# Patient Record
Sex: Female | Born: 1945 | Race: Black or African American | Hispanic: No | Marital: Single
Health system: Southern US, Community
[De-identification: ages and names within clinical notes are randomized; demographics above are authoritative.]

---

## 1998-07-29 ENCOUNTER — Encounter: Admission: RE | Admit: 1998-07-29 | Discharge: 1998-07-29 | Payer: Self-pay | Admitting: Internal Medicine

## 1998-08-27 ENCOUNTER — Encounter: Admission: RE | Admit: 1998-08-27 | Discharge: 1998-08-27 | Payer: Self-pay | Admitting: Internal Medicine

## 1998-09-11 ENCOUNTER — Encounter: Admission: RE | Admit: 1998-09-11 | Discharge: 1998-09-11 | Payer: Self-pay | Admitting: Internal Medicine

## 1998-10-10 ENCOUNTER — Encounter: Admission: RE | Admit: 1998-10-10 | Discharge: 1998-10-10 | Payer: Self-pay | Admitting: Internal Medicine

## 1998-10-17 ENCOUNTER — Encounter: Admission: RE | Admit: 1998-10-17 | Discharge: 1998-10-17 | Payer: Self-pay | Admitting: Hematology and Oncology

## 1998-11-22 ENCOUNTER — Encounter: Admission: RE | Admit: 1998-11-22 | Discharge: 1998-11-22 | Payer: Self-pay | Admitting: Internal Medicine

## 1998-12-06 ENCOUNTER — Encounter: Admission: RE | Admit: 1998-12-06 | Discharge: 1998-12-06 | Payer: Self-pay | Admitting: Internal Medicine

## 1999-02-05 ENCOUNTER — Encounter: Admission: RE | Admit: 1999-02-05 | Discharge: 1999-02-05 | Payer: Self-pay | Admitting: Internal Medicine

## 1999-02-18 ENCOUNTER — Encounter: Admission: RE | Admit: 1999-02-18 | Discharge: 1999-02-18 | Payer: Self-pay | Admitting: Internal Medicine

## 1999-03-21 ENCOUNTER — Encounter: Admission: RE | Admit: 1999-03-21 | Discharge: 1999-03-21 | Payer: Self-pay | Admitting: Internal Medicine

## 1999-04-24 ENCOUNTER — Encounter: Payer: Self-pay | Admitting: *Deleted

## 1999-04-24 ENCOUNTER — Emergency Department (HOSPITAL_COMMUNITY): Admission: EM | Admit: 1999-04-24 | Discharge: 1999-04-24 | Payer: Self-pay | Admitting: Emergency Medicine

## 1999-04-24 ENCOUNTER — Encounter: Admission: RE | Admit: 1999-04-24 | Discharge: 1999-04-24 | Payer: Self-pay | Admitting: Internal Medicine

## 1999-04-28 ENCOUNTER — Encounter: Admission: RE | Admit: 1999-04-28 | Discharge: 1999-04-28 | Payer: Self-pay | Admitting: Internal Medicine

## 1999-06-04 ENCOUNTER — Encounter: Admission: RE | Admit: 1999-06-04 | Discharge: 1999-06-04 | Payer: Self-pay | Admitting: Internal Medicine

## 1999-07-30 ENCOUNTER — Encounter: Admission: RE | Admit: 1999-07-30 | Discharge: 1999-07-30 | Payer: Self-pay | Admitting: Internal Medicine

## 1999-09-24 ENCOUNTER — Encounter: Admission: RE | Admit: 1999-09-24 | Discharge: 1999-09-24 | Payer: Self-pay | Admitting: Internal Medicine

## 1999-10-10 ENCOUNTER — Encounter: Admission: RE | Admit: 1999-10-10 | Discharge: 1999-10-10 | Payer: Self-pay | Admitting: Internal Medicine

## 1999-12-03 ENCOUNTER — Encounter: Admission: RE | Admit: 1999-12-03 | Discharge: 1999-12-03 | Payer: Self-pay | Admitting: Internal Medicine

## 2000-02-03 ENCOUNTER — Encounter: Admission: RE | Admit: 2000-02-03 | Discharge: 2000-02-03 | Payer: Self-pay | Admitting: Internal Medicine

## 2000-04-07 ENCOUNTER — Encounter: Admission: RE | Admit: 2000-04-07 | Discharge: 2000-04-07 | Payer: Self-pay | Admitting: Internal Medicine

## 2000-05-27 ENCOUNTER — Ambulatory Visit (HOSPITAL_BASED_OUTPATIENT_CLINIC_OR_DEPARTMENT_OTHER): Admission: RE | Admit: 2000-05-27 | Discharge: 2000-05-27 | Payer: Self-pay | Admitting: Orthopedic Surgery

## 2000-06-01 ENCOUNTER — Ambulatory Visit (HOSPITAL_COMMUNITY): Admission: RE | Admit: 2000-06-01 | Discharge: 2000-06-01 | Payer: Self-pay | Admitting: Orthopaedic Surgery

## 2000-06-24 ENCOUNTER — Emergency Department (HOSPITAL_COMMUNITY): Admission: EM | Admit: 2000-06-24 | Discharge: 2000-06-24 | Payer: Self-pay | Admitting: Emergency Medicine

## 2000-06-24 ENCOUNTER — Encounter: Payer: Self-pay | Admitting: Emergency Medicine

## 2000-07-19 ENCOUNTER — Encounter: Admission: RE | Admit: 2000-07-19 | Discharge: 2000-07-19 | Payer: Self-pay | Admitting: Internal Medicine

## 2000-08-23 ENCOUNTER — Encounter: Admission: RE | Admit: 2000-08-23 | Discharge: 2000-08-23 | Payer: Self-pay | Admitting: Internal Medicine

## 2000-08-25 ENCOUNTER — Inpatient Hospital Stay (HOSPITAL_COMMUNITY): Admission: EM | Admit: 2000-08-25 | Discharge: 2000-08-27 | Payer: Self-pay | Admitting: Emergency Medicine

## 2000-08-25 ENCOUNTER — Encounter: Payer: Self-pay | Admitting: Infectious Diseases

## 2000-08-30 ENCOUNTER — Encounter: Admission: RE | Admit: 2000-08-30 | Discharge: 2000-08-30 | Payer: Self-pay

## 2000-09-02 ENCOUNTER — Emergency Department (HOSPITAL_COMMUNITY): Admission: EM | Admit: 2000-09-02 | Discharge: 2000-09-02 | Payer: Self-pay | Admitting: Emergency Medicine

## 2000-09-03 ENCOUNTER — Encounter: Admission: RE | Admit: 2000-09-03 | Discharge: 2000-09-03 | Payer: Self-pay

## 2000-09-06 ENCOUNTER — Encounter: Admission: RE | Admit: 2000-09-06 | Discharge: 2000-09-06 | Payer: Self-pay

## 2000-09-22 ENCOUNTER — Encounter: Admission: RE | Admit: 2000-09-22 | Discharge: 2000-09-22 | Payer: Self-pay | Admitting: Internal Medicine

## 2000-12-24 ENCOUNTER — Encounter: Admission: RE | Admit: 2000-12-24 | Discharge: 2000-12-24 | Payer: Self-pay | Admitting: Internal Medicine

## 2001-07-06 ENCOUNTER — Encounter: Admission: RE | Admit: 2001-07-06 | Discharge: 2001-07-06 | Payer: Self-pay | Admitting: Internal Medicine

## 2001-07-07 ENCOUNTER — Emergency Department (HOSPITAL_COMMUNITY): Admission: EM | Admit: 2001-07-07 | Discharge: 2001-07-07 | Payer: Self-pay | Admitting: Emergency Medicine

## 2001-07-07 ENCOUNTER — Encounter: Payer: Self-pay | Admitting: Emergency Medicine

## 2001-07-08 ENCOUNTER — Encounter: Admission: RE | Admit: 2001-07-08 | Discharge: 2001-07-08 | Payer: Self-pay

## 2001-07-13 ENCOUNTER — Encounter: Admission: RE | Admit: 2001-07-13 | Discharge: 2001-07-13 | Payer: Self-pay | Admitting: Internal Medicine

## 2001-08-04 ENCOUNTER — Encounter: Admission: RE | Admit: 2001-08-04 | Discharge: 2001-08-04 | Payer: Self-pay | Admitting: Internal Medicine

## 2001-09-20 ENCOUNTER — Encounter: Payer: Self-pay | Admitting: Gastroenterology

## 2001-09-20 ENCOUNTER — Encounter: Admission: RE | Admit: 2001-09-20 | Discharge: 2001-09-20 | Payer: Self-pay | Admitting: Gastroenterology

## 2003-11-18 ENCOUNTER — Emergency Department (HOSPITAL_COMMUNITY): Admission: EM | Admit: 2003-11-18 | Discharge: 2003-11-18 | Payer: Self-pay | Admitting: Emergency Medicine

## 2004-03-09 ENCOUNTER — Emergency Department (HOSPITAL_COMMUNITY): Admission: EM | Admit: 2004-03-09 | Discharge: 2004-03-09 | Payer: Self-pay | Admitting: Family Medicine

## 2004-04-01 ENCOUNTER — Emergency Department (HOSPITAL_COMMUNITY): Admission: EM | Admit: 2004-04-01 | Discharge: 2004-04-02 | Payer: Self-pay | Admitting: Emergency Medicine

## 2004-07-24 ENCOUNTER — Emergency Department (HOSPITAL_COMMUNITY): Admission: EM | Admit: 2004-07-24 | Discharge: 2004-07-24 | Payer: Self-pay | Admitting: Family Medicine

## 2007-05-24 ENCOUNTER — Emergency Department (HOSPITAL_COMMUNITY): Admission: EM | Admit: 2007-05-24 | Discharge: 2007-05-24 | Payer: Self-pay | Admitting: Emergency Medicine

## 2008-01-17 ENCOUNTER — Emergency Department (HOSPITAL_COMMUNITY): Admission: EM | Admit: 2008-01-17 | Discharge: 2008-01-17 | Payer: Self-pay | Admitting: Emergency Medicine

## 2008-04-06 ENCOUNTER — Encounter: Payer: Self-pay | Admitting: Internal Medicine

## 2008-12-12 ENCOUNTER — Observation Stay: Payer: Self-pay | Admitting: Internal Medicine

## 2008-12-28 ENCOUNTER — Ambulatory Visit: Payer: Self-pay | Admitting: Cardiology

## 2008-12-28 ENCOUNTER — Inpatient Hospital Stay (HOSPITAL_COMMUNITY): Admission: EM | Admit: 2008-12-28 | Discharge: 2008-12-30 | Payer: Self-pay | Admitting: Emergency Medicine

## 2008-12-28 ENCOUNTER — Encounter (INDEPENDENT_AMBULATORY_CARE_PROVIDER_SITE_OTHER): Payer: Self-pay | Admitting: Internal Medicine

## 2009-02-27 ENCOUNTER — Inpatient Hospital Stay (HOSPITAL_COMMUNITY): Admission: EM | Admit: 2009-02-27 | Discharge: 2009-03-01 | Payer: Self-pay | Admitting: Emergency Medicine

## 2009-02-27 ENCOUNTER — Ambulatory Visit: Payer: Self-pay | Admitting: Infectious Diseases

## 2009-03-07 ENCOUNTER — Emergency Department (HOSPITAL_COMMUNITY): Admission: EM | Admit: 2009-03-07 | Discharge: 2009-03-07 | Payer: Self-pay | Admitting: Emergency Medicine

## 2009-03-29 ENCOUNTER — Inpatient Hospital Stay: Payer: Self-pay | Admitting: Psychiatry

## 2009-04-04 ENCOUNTER — Ambulatory Visit: Payer: Self-pay | Admitting: Unknown Physician Specialty

## 2009-04-15 ENCOUNTER — Ambulatory Visit: Payer: Self-pay | Admitting: Unknown Physician Specialty

## 2009-05-15 ENCOUNTER — Ambulatory Visit: Payer: Self-pay | Admitting: Unknown Physician Specialty

## 2009-07-03 ENCOUNTER — Ambulatory Visit: Payer: Self-pay | Admitting: Physician Assistant

## 2009-10-16 ENCOUNTER — Ambulatory Visit: Payer: Self-pay | Admitting: Unknown Physician Specialty

## 2010-01-08 ENCOUNTER — Inpatient Hospital Stay: Payer: Self-pay | Admitting: Internal Medicine

## 2010-04-03 ENCOUNTER — Ambulatory Visit: Payer: Self-pay | Admitting: Gastroenterology

## 2010-04-23 ENCOUNTER — Ambulatory Visit: Payer: Self-pay | Admitting: Gastroenterology

## 2010-05-14 ENCOUNTER — Ambulatory Visit: Payer: Self-pay | Admitting: Unknown Physician Specialty

## 2010-06-10 ENCOUNTER — Inpatient Hospital Stay: Payer: Self-pay | Admitting: Internal Medicine

## 2010-06-18 ENCOUNTER — Emergency Department: Payer: Self-pay | Admitting: Emergency Medicine

## 2010-07-18 ENCOUNTER — Ambulatory Visit: Payer: Self-pay | Admitting: Internal Medicine

## 2010-09-02 ENCOUNTER — Ambulatory Visit: Payer: Self-pay | Admitting: Unknown Physician Specialty

## 2010-09-09 ENCOUNTER — Ambulatory Visit: Payer: Self-pay | Admitting: Unknown Physician Specialty

## 2010-09-10 ENCOUNTER — Emergency Department (HOSPITAL_COMMUNITY): Payer: Medicare Other

## 2010-09-10 ENCOUNTER — Inpatient Hospital Stay (HOSPITAL_COMMUNITY)
Admission: EM | Admit: 2010-09-10 | Discharge: 2010-09-15 | DRG: 069 | Disposition: A | Discharge status: Rehabilitation Facility | Payer: Medicare Other | Attending: Family Medicine | Admitting: Family Medicine

## 2010-09-10 DIAGNOSIS — F319 Bipolar disorder, unspecified: Secondary | ICD-10-CM | POA: Diagnosis present

## 2010-09-10 DIAGNOSIS — I10 Essential (primary) hypertension: Secondary | ICD-10-CM | POA: Diagnosis present

## 2010-09-10 DIAGNOSIS — IMO0002 Reserved for concepts with insufficient information to code with codable children: Secondary | ICD-10-CM | POA: Diagnosis present

## 2010-09-10 DIAGNOSIS — Z7902 Long term (current) use of antithrombotics/antiplatelets: Secondary | ICD-10-CM

## 2010-09-10 DIAGNOSIS — Z7982 Long term (current) use of aspirin: Secondary | ICD-10-CM

## 2010-09-10 DIAGNOSIS — Z8619 Personal history of other infectious and parasitic diseases: Secondary | ICD-10-CM

## 2010-09-10 DIAGNOSIS — F17200 Nicotine dependence, unspecified, uncomplicated: Secondary | ICD-10-CM | POA: Diagnosis present

## 2010-09-10 DIAGNOSIS — G8190 Hemiplegia, unspecified affecting unspecified side: Secondary | ICD-10-CM | POA: Diagnosis present

## 2010-09-10 DIAGNOSIS — K7460 Unspecified cirrhosis of liver: Secondary | ICD-10-CM | POA: Diagnosis present

## 2010-09-10 DIAGNOSIS — Z9181 History of falling: Secondary | ICD-10-CM

## 2010-09-10 DIAGNOSIS — I252 Old myocardial infarction: Secondary | ICD-10-CM

## 2010-09-10 DIAGNOSIS — R52 Pain, unspecified: Secondary | ICD-10-CM | POA: Diagnosis present

## 2010-09-10 DIAGNOSIS — G459 Transient cerebral ischemic attack, unspecified: Principal | ICD-10-CM | POA: Diagnosis present

## 2010-09-10 DIAGNOSIS — Z880 Allergy status to penicillin: Secondary | ICD-10-CM

## 2010-09-10 LAB — COMPREHENSIVE METABOLIC PANEL
Alkaline Phosphatase: 81 U/L (ref 39–117)
BUN: 15 mg/dL (ref 6–23)
Chloride: 105 mEq/L (ref 96–112)
Creatinine, Ser: 0.94 mg/dL (ref 0.4–1.2)
GFR calc non Af Amer: 60 mL/min — ABNORMAL LOW (ref >60)
Glucose, Bld: 114 mg/dL — ABNORMAL HIGH (ref 70–99)
Potassium: 4.1 mEq/L (ref 3.5–5.1)
Total Bilirubin: 0.8 mg/dL (ref 0.3–1.2)

## 2010-09-10 LAB — ETHANOL: Alcohol, Ethyl (B): <5 mg/dL (ref 0–10)

## 2010-09-10 LAB — TROPONIN I: Troponin I: 0.05 ng/mL (ref 0.00–0.06)

## 2010-09-10 LAB — CK TOTAL AND CKMB (NOT AT ARMC): Relative Index: 0.4 (ref 0.0–2.5)

## 2010-09-10 LAB — PROTIME-INR: Prothrombin Time: 12 seconds (ref 11.6–15.2)

## 2010-09-10 LAB — POCT I-STAT, CHEM 8
Creatinine, Ser: 1 mg/dL (ref 0.4–1.2)
Glucose, Bld: 109 mg/dL — ABNORMAL HIGH (ref 70–99)
HCT: 39 % (ref 36.0–46.0)
Hemoglobin: 13.3 g/dL (ref 12.0–15.0)
Potassium: 4 mEq/L (ref 3.5–5.1)
Sodium: 140 mEq/L (ref 135–145)
TCO2: 27 mmol/L (ref 0–100)

## 2010-09-10 LAB — DIFFERENTIAL
Basophils Absolute: 0 10*3/uL (ref 0.0–0.1)
Basophils Relative: 0 % (ref 0–1)
Eosinophils Absolute: 0.1 10*3/uL (ref 0.0–0.7)
Monocytes Relative: 11 % (ref 3–12)
Neutro Abs: 3.9 10*3/uL (ref 1.7–7.7)
Neutrophils Relative %: 55 % (ref 43–77)

## 2010-09-10 LAB — CBC
Hemoglobin: 12.1 g/dL (ref 12.0–15.0)
MCH: 32.3 pg (ref 26.0–34.0)
MCHC: 31.3 g/dL (ref 30.0–36.0)
Platelets: 161 10*3/uL (ref 150–400)
RBC: 3.75 MIL/uL — ABNORMAL LOW (ref 3.87–5.11)

## 2010-09-10 LAB — APTT: aPTT: 22 seconds — ABNORMAL LOW (ref 24–37)

## 2010-09-10 MED ORDER — IOHEXOL 350 MG/ML SOLN: 50.0000 mL | Freq: Once | INTRAVENOUS | Status: AC | PRN

## 2010-09-11 DIAGNOSIS — R5383 Other fatigue: Secondary | ICD-10-CM

## 2010-09-11 DIAGNOSIS — R4182 Altered mental status, unspecified: Secondary | ICD-10-CM

## 2010-09-11 DIAGNOSIS — R5381 Other malaise: Secondary | ICD-10-CM

## 2010-09-11 LAB — CARDIAC PANEL(CRET KIN+CKTOT+MB+TROPI)
CK, MB: 2 ng/mL (ref 0.3–4.0)
Relative Index: 0.4 (ref 0.0–2.5)
Total CK: 490 U/L — ABNORMAL HIGH (ref 7–177)
Troponin I: 0.04 ng/mL (ref 0.00–0.06)

## 2010-09-11 LAB — CBC
HCT: 36.1 % (ref 36.0–46.0)
Hemoglobin: 11.3 g/dL — ABNORMAL LOW (ref 12.0–15.0)
MCH: 31.7 pg (ref 26.0–34.0)
MCV: 101.1 fL — ABNORMAL HIGH (ref 78.0–100.0)
Platelets: 157 10*3/uL (ref 150–400)
RBC: 3.57 MIL/uL — ABNORMAL LOW (ref 3.87–5.11)
WBC: 6.1 10*3/uL (ref 4.0–10.5)

## 2010-09-11 LAB — BASIC METABOLIC PANEL
BUN: 12 mg/dL (ref 6–23)
CO2: 27 mEq/L (ref 19–32)
Chloride: 104 mEq/L (ref 96–112)
Potassium: 3.8 mEq/L (ref 3.5–5.1)

## 2010-09-11 LAB — LIPID PANEL
Cholesterol: 195 mg/dL (ref 0–200)
HDL: 70 mg/dL (ref >39)
LDL Cholesterol: 101 mg/dL — ABNORMAL HIGH (ref 0–99)

## 2010-09-11 LAB — VITAMIN B12: Vitamin B-12: 346 pg/mL (ref 211–911)

## 2010-09-11 LAB — RAPID URINE DRUG SCREEN, HOSP PERFORMED: Barbiturates: NOT DETECTED

## 2010-09-11 LAB — TSH: TSH: 1.844 u[IU]/mL (ref 0.350–4.500)

## 2010-09-13 LAB — CBC
HCT: 36.5 % (ref 36.0–46.0)
Hemoglobin: 11.5 g/dL — ABNORMAL LOW (ref 12.0–15.0)
MCV: 101.7 fL — ABNORMAL HIGH (ref 78.0–100.0)
RBC: 3.59 MIL/uL — ABNORMAL LOW (ref 3.87–5.11)
RDW: 13.8 % (ref 11.5–15.5)
WBC: 5.9 10*3/uL (ref 4.0–10.5)

## 2010-09-13 LAB — BASIC METABOLIC PANEL
BUN: 13 mg/dL (ref 6–23)
CO2: 28 mEq/L (ref 19–32)
Chloride: 105 mEq/L (ref 96–112)
Glucose, Bld: 115 mg/dL — ABNORMAL HIGH (ref 70–99)
Potassium: 3.7 mEq/L (ref 3.5–5.1)
Sodium: 139 mEq/L (ref 135–145)

## 2010-09-15 ENCOUNTER — Other Ambulatory Visit (HOSPITAL_COMMUNITY): Payer: Medicare Other

## 2010-09-15 ENCOUNTER — Inpatient Hospital Stay (HOSPITAL_COMMUNITY)
Admission: RE | Admit: 2010-09-15 | Discharge: 2010-09-23 | DRG: 945 | Disposition: A | Discharge status: Other Acute Inpatient Hospital | Payer: Medicare Other | Source: Other Acute Inpatient Hospital | Attending: Physical Medicine & Rehabilitation | Admitting: Physical Medicine & Rehabilitation

## 2010-09-15 ENCOUNTER — Inpatient Hospital Stay (HOSPITAL_COMMUNITY): Payer: Medicare Other

## 2010-09-15 DIAGNOSIS — R269 Unspecified abnormalities of gait and mobility: Secondary | ICD-10-CM

## 2010-09-15 DIAGNOSIS — R5381 Other malaise: Secondary | ICD-10-CM

## 2010-09-15 DIAGNOSIS — I2510 Atherosclerotic heart disease of native coronary artery without angina pectoris: Secondary | ICD-10-CM | POA: Diagnosis present

## 2010-09-15 DIAGNOSIS — I6350 Cerebral infarction due to unspecified occlusion or stenosis of unspecified cerebral artery: Secondary | ICD-10-CM | POA: Diagnosis present

## 2010-09-15 DIAGNOSIS — I10 Essential (primary) hypertension: Secondary | ICD-10-CM | POA: Diagnosis present

## 2010-09-15 DIAGNOSIS — F319 Bipolar disorder, unspecified: Secondary | ICD-10-CM | POA: Diagnosis present

## 2010-09-15 DIAGNOSIS — Z9889 Other specified postprocedural states: Secondary | ICD-10-CM

## 2010-09-15 DIAGNOSIS — I252 Old myocardial infarction: Secondary | ICD-10-CM

## 2010-09-15 DIAGNOSIS — N179 Acute kidney failure, unspecified: Secondary | ICD-10-CM | POA: Diagnosis not present

## 2010-09-15 DIAGNOSIS — I672 Cerebral atherosclerosis: Secondary | ICD-10-CM | POA: Diagnosis present

## 2010-09-15 DIAGNOSIS — Z66 Do not resuscitate: Secondary | ICD-10-CM | POA: Diagnosis present

## 2010-09-15 DIAGNOSIS — G8190 Hemiplegia, unspecified affecting unspecified side: Secondary | ICD-10-CM | POA: Diagnosis present

## 2010-09-15 DIAGNOSIS — N390 Urinary tract infection, site not specified: Secondary | ICD-10-CM | POA: Diagnosis not present

## 2010-09-15 DIAGNOSIS — F0150 Vascular dementia without behavioral disturbance: Secondary | ICD-10-CM | POA: Diagnosis present

## 2010-09-15 DIAGNOSIS — G609 Hereditary and idiopathic neuropathy, unspecified: Secondary | ICD-10-CM | POA: Diagnosis present

## 2010-09-15 DIAGNOSIS — Z7902 Long term (current) use of antithrombotics/antiplatelets: Secondary | ICD-10-CM

## 2010-09-15 DIAGNOSIS — Z880 Allergy status to penicillin: Secondary | ICD-10-CM

## 2010-09-15 DIAGNOSIS — Z5189 Encounter for other specified aftercare: Secondary | ICD-10-CM

## 2010-09-15 DIAGNOSIS — Z8619 Personal history of other infectious and parasitic diseases: Secondary | ICD-10-CM

## 2010-09-15 DIAGNOSIS — F17200 Nicotine dependence, unspecified, uncomplicated: Secondary | ICD-10-CM | POA: Diagnosis present

## 2010-09-15 DIAGNOSIS — IMO0002 Reserved for concepts with insufficient information to code with codable children: Secondary | ICD-10-CM | POA: Diagnosis present

## 2010-09-15 DIAGNOSIS — J45909 Unspecified asthma, uncomplicated: Secondary | ICD-10-CM | POA: Diagnosis present

## 2010-09-15 DIAGNOSIS — Z9181 History of falling: Secondary | ICD-10-CM

## 2010-09-15 DIAGNOSIS — G9341 Metabolic encephalopathy: Secondary | ICD-10-CM

## 2010-09-15 DIAGNOSIS — F22 Delusional disorders: Secondary | ICD-10-CM | POA: Diagnosis not present

## 2010-09-15 DIAGNOSIS — R1310 Dysphagia, unspecified: Secondary | ICD-10-CM | POA: Diagnosis not present

## 2010-09-16 DIAGNOSIS — Z5189 Encounter for other specified aftercare: Secondary | ICD-10-CM

## 2010-09-16 DIAGNOSIS — G9341 Metabolic encephalopathy: Secondary | ICD-10-CM

## 2010-09-16 DIAGNOSIS — R269 Unspecified abnormalities of gait and mobility: Secondary | ICD-10-CM

## 2010-09-16 LAB — CBC
HCT: 39.2 % (ref 36.0–46.0)
MCH: 31.9 pg (ref 26.0–34.0)
MCV: 98.5 fL (ref 78.0–100.0)
Platelets: 188 10*3/uL (ref 150–400)
RDW: 13.2 % (ref 11.5–15.5)

## 2010-09-16 LAB — COMPREHENSIVE METABOLIC PANEL
ALT: 40 U/L — ABNORMAL HIGH (ref 0–35)
Alkaline Phosphatase: 81 U/L (ref 39–117)
BUN: 12 mg/dL (ref 6–23)
CO2: 26 mEq/L (ref 19–32)
Calcium: 9.3 mg/dL (ref 8.4–10.5)
GFR calc non Af Amer: >60 mL/min (ref >60)
Glucose, Bld: 95 mg/dL (ref 70–99)
Potassium: 3.4 mEq/L — ABNORMAL LOW (ref 3.5–5.1)
Sodium: 137 mEq/L (ref 135–145)
Total Protein: 6.8 g/dL (ref 6.0–8.3)

## 2010-09-16 LAB — DIFFERENTIAL
Eosinophils Absolute: 0.1 10*3/uL (ref 0.0–0.7)
Eosinophils Relative: 1 % (ref 0–5)
Lymphocytes Relative: 26 % (ref 12–46)
Lymphs Abs: 2.3 10*3/uL (ref 0.7–4.0)
Monocytes Relative: 6 % (ref 3–12)

## 2010-09-18 LAB — URINALYSIS, ROUTINE W REFLEX MICROSCOPIC
Glucose, UA: NEGATIVE mg/dL
Ketones, ur: NEGATIVE mg/dL
Leukocytes, UA: NEGATIVE
Nitrite: NEGATIVE
Protein, ur: NEGATIVE mg/dL

## 2010-09-18 LAB — BASIC METABOLIC PANEL
BUN: 22 mg/dL (ref 6–23)
CO2: 24 mEq/L (ref 19–32)
Calcium: 9.5 mg/dL (ref 8.4–10.5)
Chloride: 103 mEq/L (ref 96–112)
Creatinine, Ser: 1.11 mg/dL (ref 0.4–1.2)
Glucose, Bld: 103 mg/dL — ABNORMAL HIGH (ref 70–99)

## 2010-09-18 LAB — URINE MICROSCOPIC-ADD ON

## 2010-09-19 LAB — URINALYSIS, ROUTINE W REFLEX MICROSCOPIC
Bilirubin Urine: NEGATIVE
Hgb urine dipstick: NEGATIVE
Ketones, ur: 15 mg/dL — AB
Ketones, ur: NEGATIVE mg/dL
Nitrite: NEGATIVE
Specific Gravity, Urine: 1.034 — ABNORMAL HIGH (ref 1.005–1.030)
Specific Gravity, Urine: 1.01 (ref 1.005–1.030)
Urobilinogen, UA: 0.2 mg/dL (ref 0.0–1.0)
pH: 6 (ref 5.0–8.0)

## 2010-09-19 LAB — CK TOTAL AND CKMB (NOT AT ARMC)
CK, MB: 1.1 ng/mL (ref 0.3–4.0)
CK, MB: 1.1 ng/mL (ref 0.3–4.0)
CK, MB: 1.2 ng/mL (ref 0.3–4.0)
Relative Index: INVALID (ref 0.0–2.5)
Total CK: 33 U/L (ref 7–177)

## 2010-09-19 LAB — BASIC METABOLIC PANEL
CO2: 24 mEq/L (ref 19–32)
Calcium: 9.1 mg/dL (ref 8.4–10.5)
Chloride: 107 mEq/L (ref 96–112)
Chloride: 109 mEq/L (ref 96–112)
GFR calc Af Amer: >60 mL/min (ref >60)
GFR calc non Af Amer: >60 mL/min (ref >60)
GFR calc non Af Amer: >60 mL/min (ref >60)
Glucose, Bld: 104 mg/dL — ABNORMAL HIGH (ref 70–99)
Glucose, Bld: 83 mg/dL (ref 70–99)
Glucose, Bld: 104 mg/dL — ABNORMAL HIGH (ref 70–99)
Potassium: 4.1 mEq/L (ref 3.5–5.1)
Potassium: 4.2 mEq/L (ref 3.5–5.1)
Potassium: 4.2 mEq/L (ref 3.5–5.1)
Sodium: 136 mEq/L (ref 135–145)
Sodium: 139 mEq/L (ref 135–145)
Sodium: 138 mEq/L (ref 135–145)

## 2010-09-19 LAB — URINE MICROSCOPIC-ADD ON

## 2010-09-19 LAB — CBC
HCT: 37.3 % (ref 36.0–46.0)
HCT: 37.6 % (ref 36.0–46.0)
Hemoglobin: 12.4 g/dL (ref 12.0–15.0)
Hemoglobin: 13.3 g/dL (ref 12.0–15.0)
Hemoglobin: 12.4 g/dL (ref 12.0–15.0)
MCHC: 32.9 g/dL (ref 30.0–36.0)
MCV: 105 fL — ABNORMAL HIGH (ref 78.0–100.0)
MCV: 105.7 fL — ABNORMAL HIGH (ref 78.0–100.0)
RBC: 3.84 MIL/uL — ABNORMAL LOW (ref 3.87–5.11)
RDW: 13.4 % (ref 11.5–15.5)
RDW: 13.5 % (ref 11.5–15.5)
RDW: 13.6 % (ref 11.5–15.5)
WBC: 6.6 10*3/uL (ref 4.0–10.5)
WBC: 6.9 10*3/uL (ref 4.0–10.5)

## 2010-09-19 LAB — TROPONIN I
Troponin I: 0.02 ng/mL (ref 0.00–0.06)
Troponin I: 0.02 ng/mL (ref 0.00–0.06)

## 2010-09-19 LAB — RAPID URINE DRUG SCREEN, HOSP PERFORMED
Barbiturates: POSITIVE — AB
Opiates: POSITIVE — AB
Tetrahydrocannabinol: NOT DETECTED

## 2010-09-19 LAB — LIPASE, BLOOD: Lipase: 19 U/L (ref 11–59)

## 2010-09-19 LAB — DIFFERENTIAL
Eosinophils Relative: 0 % (ref 0–5)
Lymphocytes Relative: 38 % (ref 12–46)
Lymphs Abs: 2.4 10*3/uL (ref 0.7–4.0)
Monocytes Relative: 5 % (ref 3–12)

## 2010-09-19 LAB — HEPATIC FUNCTION PANEL
ALT: 61 U/L — ABNORMAL HIGH (ref 0–35)
Alkaline Phosphatase: 69 U/L (ref 39–117)
Indirect Bilirubin: 0.4 mg/dL (ref 0.3–0.9)
Total Protein: 6.8 g/dL (ref 6.0–8.3)

## 2010-09-19 LAB — LIPID PANEL
Total CHOL/HDL Ratio: 2.2 RATIO
VLDL: 19 mg/dL (ref 0–40)

## 2010-09-19 LAB — D-DIMER, QUANTITATIVE: D-Dimer, Quant: 0.23 ug/mL-FEU (ref 0.00–0.48)

## 2010-09-19 LAB — URINE CULTURE
Colony Count: NO GROWTH
Culture: NO GROWTH

## 2010-09-19 LAB — HIV ANTIBODY (ROUTINE TESTING W REFLEX): HIV: NONREACTIVE

## 2010-09-19 LAB — VITAMIN B12: Vitamin B-12: 740 pg/mL (ref 211–911)

## 2010-09-21 ENCOUNTER — Inpatient Hospital Stay (HOSPITAL_COMMUNITY): Payer: Medicare Other

## 2010-09-21 LAB — TSH: TSH: 3.179 u[IU]/mL (ref 0.350–4.500)

## 2010-09-21 LAB — COMPREHENSIVE METABOLIC PANEL
ALT: 19 U/L (ref 0–35)
Alkaline Phosphatase: 87 U/L (ref 39–117)
Alkaline Phosphatase: 65 U/L (ref 39–117)
BUN: 4 mg/dL — ABNORMAL LOW (ref 6–23)
BUN: 31 mg/dL — ABNORMAL HIGH (ref 6–23)
CO2: 26 mEq/L (ref 19–32)
CO2: 18 mEq/L — ABNORMAL LOW (ref 19–32)
Calcium: 9.1 mg/dL (ref 8.4–10.5)
Chloride: 105 mEq/L (ref 96–112)
GFR calc non Af Amer: >60 mL/min (ref >60)
GFR calc non Af Amer: 32 mL/min — ABNORMAL LOW (ref >60)
Glucose, Bld: 87 mg/dL (ref 70–99)
Glucose, Bld: 107 mg/dL — ABNORMAL HIGH (ref 70–99)
Potassium: 4.7 mEq/L (ref 3.5–5.1)
Sodium: 140 mEq/L (ref 135–145)
Total Bilirubin: 0.8 mg/dL (ref 0.3–1.2)
Total Protein: 6.2 g/dL (ref 6.0–8.3)

## 2010-09-21 LAB — URINALYSIS, ROUTINE W REFLEX MICROSCOPIC
Glucose, UA: NEGATIVE mg/dL
Ketones, ur: 15 mg/dL — AB
Nitrite: POSITIVE — AB
Protein, ur: 30 mg/dL — AB

## 2010-09-21 LAB — CARDIAC PANEL(CRET KIN+CKTOT+MB+TROPI)
CK, MB: 1.3 ng/mL (ref 0.3–4.0)
Relative Index: INVALID (ref 0.0–2.5)
Total CK: 45 U/L (ref 7–177)
Troponin I: 0.01 ng/mL (ref 0.00–0.06)
Troponin I: 0.02 ng/mL (ref 0.00–0.06)

## 2010-09-21 LAB — BLOOD GAS, ARTERIAL
Acid-base deficit: 0.6 mmol/L (ref 0.0–2.0)
Acid-base deficit: 1.6 mmol/L (ref 0.0–2.0)
Drawn by: 23337
O2 Content: 2 L/min
O2 Saturation: 95.9 %
Patient temperature: 98.6
TCO2: 20.3 mmol/L (ref 0–100)
pCO2 arterial: 35.2 mmHg (ref 35.0–45.0)
pO2, Arterial: 65.1 mmHg — ABNORMAL LOW (ref 80.0–100.0)

## 2010-09-21 LAB — MAGNESIUM: Magnesium: 1.7 mg/dL (ref 1.5–2.5)

## 2010-09-21 LAB — LIPID PANEL
HDL: 65 mg/dL (ref >39)
Triglycerides: 87 mg/dL (ref <150)
VLDL: 17 mg/dL (ref 0–40)

## 2010-09-21 LAB — BASIC METABOLIC PANEL
Calcium: 9.1 mg/dL (ref 8.4–10.5)
Creatinine, Ser: 0.67 mg/dL (ref 0.4–1.2)
GFR calc Af Amer: >60 mL/min (ref >60)

## 2010-09-21 LAB — CBC
HCT: 40.5 % (ref 36.0–46.0)
HCT: 36.7 % (ref 36.0–46.0)
Hemoglobin: 13 g/dL (ref 12.0–15.0)
Hemoglobin: 11.8 g/dL — ABNORMAL LOW (ref 12.0–15.0)
MCHC: 32.2 g/dL (ref 30.0–36.0)
MCV: 101.7 fL — ABNORMAL HIGH (ref 78.0–100.0)
RBC: 3.73 MIL/uL — ABNORMAL LOW (ref 3.87–5.11)
RDW: 13.7 % (ref 11.5–15.5)
WBC: 9.5 10*3/uL (ref 4.0–10.5)

## 2010-09-21 LAB — HEMOGLOBIN A1C: Hgb A1c MFr Bld: 5.7 % (ref 4.6–6.1)

## 2010-09-22 LAB — CBC
HCT: 32.5 % — ABNORMAL LOW (ref 36.0–46.0)
HCT: 37.4 % (ref 36.0–46.0)
Hemoglobin: 10.2 g/dL — ABNORMAL LOW (ref 12.0–15.0)
MCH: 32.4 pg (ref 26.0–34.0)
MCHC: 31.4 g/dL (ref 30.0–36.0)
MCHC: 33.1 g/dL (ref 30.0–36.0)
MCV: 103.2 fL — ABNORMAL HIGH (ref 78.0–100.0)
MCV: 108.3 fL — ABNORMAL HIGH (ref 78.0–100.0)
Platelets: 185 10*3/uL (ref 150–400)
RDW: 13.8 % (ref 11.5–15.5)

## 2010-09-22 LAB — URINALYSIS, ROUTINE W REFLEX MICROSCOPIC
Ketones, ur: NEGATIVE mg/dL
Nitrite: NEGATIVE
Protein, ur: NEGATIVE mg/dL
Urobilinogen, UA: 1 mg/dL (ref 0.0–1.0)

## 2010-09-22 LAB — AMMONIA: Ammonia: 17 umol/L (ref 11–35)

## 2010-09-22 LAB — COMPREHENSIVE METABOLIC PANEL
AST: 32 U/L (ref 0–37)
Albumin: 2.9 g/dL — ABNORMAL LOW (ref 3.5–5.2)
BUN: 10 mg/dL (ref 6–23)
Calcium: 9.1 mg/dL (ref 8.4–10.5)
Creatinine, Ser: 0.79 mg/dL (ref 0.4–1.2)
GFR calc Af Amer: >60 mL/min (ref >60)
GFR calc non Af Amer: >60 mL/min (ref >60)

## 2010-09-22 LAB — URINE MICROSCOPIC-ADD ON

## 2010-09-22 LAB — BASIC METABOLIC PANEL
BUN: 25 mg/dL — ABNORMAL HIGH (ref 6–23)
CO2: 25 mEq/L (ref 19–32)
Calcium: 8.6 mg/dL (ref 8.4–10.5)
Glucose, Bld: 93 mg/dL (ref 70–99)
Sodium: 141 mEq/L (ref 135–145)

## 2010-09-22 LAB — DIFFERENTIAL
Basophils Absolute: 0 10*3/uL (ref 0.0–0.1)
Eosinophils Relative: 2 % (ref 0–5)
Lymphocytes Relative: 31 % (ref 12–46)
Lymphs Abs: 1.6 10*3/uL (ref 0.7–4.0)
Monocytes Absolute: 0.4 10*3/uL (ref 0.1–1.0)
Neutro Abs: 3.2 10*3/uL (ref 1.7–7.7)

## 2010-09-22 LAB — URINE CULTURE

## 2010-09-22 LAB — RAPID URINE DRUG SCREEN, HOSP PERFORMED
Amphetamines: NOT DETECTED
Benzodiazepines: POSITIVE — AB
Cocaine: NOT DETECTED
Tetrahydrocannabinol: NOT DETECTED

## 2010-09-22 LAB — PROTIME-INR: Prothrombin Time: 12.7 seconds (ref 11.6–15.2)

## 2010-09-23 ENCOUNTER — Inpatient Hospital Stay (HOSPITAL_COMMUNITY)
Admission: AD | Admit: 2010-09-23 | Discharge: 2010-09-27 | DRG: 948 | Disposition: A | Discharge status: Home Health | Payer: Medicare Other | Source: Ambulatory Visit | Attending: Family Medicine | Admitting: Family Medicine

## 2010-09-23 DIAGNOSIS — F319 Bipolar disorder, unspecified: Secondary | ICD-10-CM

## 2010-09-23 DIAGNOSIS — F05 Delirium due to known physiological condition: Secondary | ICD-10-CM

## 2010-09-23 DIAGNOSIS — Z5189 Encounter for other specified aftercare: Secondary | ICD-10-CM

## 2010-09-23 DIAGNOSIS — I6990 Unspecified sequelae of unspecified cerebrovascular disease: Secondary | ICD-10-CM

## 2010-09-23 DIAGNOSIS — R269 Unspecified abnormalities of gait and mobility: Secondary | ICD-10-CM

## 2010-09-23 DIAGNOSIS — K769 Liver disease, unspecified: Secondary | ICD-10-CM

## 2010-09-23 DIAGNOSIS — G9341 Metabolic encephalopathy: Secondary | ICD-10-CM

## 2010-09-23 LAB — URINE CULTURE

## 2010-09-24 DIAGNOSIS — F05 Delirium due to known physiological condition: Secondary | ICD-10-CM

## 2010-09-24 LAB — COMPREHENSIVE METABOLIC PANEL
BUN: 4 mg/dL — ABNORMAL LOW (ref 6–23)
Calcium: 9.3 mg/dL (ref 8.4–10.5)
Glucose, Bld: 109 mg/dL — ABNORMAL HIGH (ref 70–99)
Sodium: 138 mEq/L (ref 135–145)
Total Protein: 6.7 g/dL (ref 6.0–8.3)

## 2010-09-24 LAB — PROTIME-INR
INR: 1.03 (ref 0.00–1.49)
Prothrombin Time: 13.7 seconds (ref 11.6–15.2)

## 2010-09-24 LAB — CBC
MCH: 32 pg (ref 26.0–34.0)
MCHC: 31.8 g/dL (ref 30.0–36.0)
Platelets: 256 10*3/uL (ref 150–400)
RBC: 3.38 MIL/uL — ABNORMAL LOW (ref 3.87–5.11)

## 2010-09-24 LAB — PREALBUMIN: Prealbumin: 11 mg/dL — ABNORMAL LOW (ref 17.0–34.0)

## 2010-09-25 DIAGNOSIS — F05 Delirium due to known physiological condition: Secondary | ICD-10-CM

## 2010-09-25 NOTE — Consult Note (Signed)
NAME:  Amber [name], Amber [name] NO.:  [id number]  MEDICAL RECORD NO.:  [id number]           PATIENT TYPE:  I  LOCATION:  5522                         FACILITY:  [facility]  PHYSICIAN:  Anselm Jungling, MD  DATE OF BIRTH:  1945/09/03  DATE OF CONSULTATION:  09/24/2010 DATE OF DISCHARGE:                                CONSULTATION   IDENTIFYING DATA AND REASON FOR REFERRAL:  This is my first contact with Amber [name], a 65 year old African-American female currently being treated here at [facility].  Psychiatric consultation is requested to assess mental status and make recommendations in relation to the patient's recent mental status changes.  HISTORY OF PRESENTING PROBLEMS:  The following information was obtained from the [facility] chart, the patient, and her adult son, who is at the bedside today.  The patient has no prior psychiatric history, whatsoever.  She has been admitted to the hospital for a variety of conditions that include congestive heart failure, Lewy body dementia, possible hepatic encephalopathy, associated with falls.  In addition, she has a history of hepatitis C and cirrhosis.  She has been treated in the past apparently for some form of mood disorder with Neurontin, Lamictal, and benzodiazepines.  Further complicating her current course is an apparent urinary tract infection.  Psychiatric consultation was prompted by the patient having some frank signs and symptoms of delirium yesterday, September 23, 2010.  Apparently, the patient was given Seroquel 25 mg at bedtime, and today, both the patient, her son, and nursing notes indicate that the patient is vastly improved today.  The patient as mentioned above, has no history of serious psychiatric illness, the specific circumstances regarding her prescriptions for Neurontin and Lamictal are not clear.  CT scan of the head done on September 21, 2010, was negative for any acute changes.  MENTAL STATUS  AND OBSERVATIONS:  The patient is a well-nourished, normally-developed adult female, who was sitting up in bed, allowing her son to feed her lunch when I came to see her.  She was alert, and appears to be fully oriented.  She was pleasant and very appropriate in her interactions with me.  She was friendly and relaxed.  She states that she knew a psychiatrist was coming to see her.  I conversed with she and her son together for a while.  The son indicates to me today that although her thinking was quite disturbed yesterday, by his description, having vivid visual hallucinations and delusional ideation, he feels that cognitively she is back to normal today.  He is very pleased with this.  He states "whatever medication they gave her last night has made a tremendous difference."  The patient made no delusional statements with me, although I did not do any formal mental status testing.  She does not demonstrate any obvious or overt signs or symptoms of cognitive disturbance, memory impairment, paranoia, delusionality, or thought disorder.  IMPRESSION:  The patient has a vague history of treatment with certain medications for mood disorder, but apparently no serious psychiatric history.  She apparently was having some transient delirium associated with some paranoid ideation yesterday.  This  was undoubtedly the product of multiple metabolic and physiologic stressors, and at this time appears to be resolved.  It may have resolved spontaneously, or the Seroquel 25 mg given at bedtime may have been a significant contributor.  DIAGNOSTIC IMPRESSION:  Axis I:  Mood disorder NOS, status post delirium secondary to medical condition. Axis II:  Deferred. Axis III:  Please see medical notes. Axis IV:  Stressors severe. Axis V:  GAF 50.  RECOMMENDATIONS:  At this time, I would recommend continuing Seroquel 25 mg at bedtime.  It is my understanding from reading the chart that there may be an effort  underway to gradually taper Lamictal and this would seem to be a reasonable thing to do.  It is not clear how Lamictal could really be a significantly beneficial medication at this point. Nonetheless, should be tapered gradually due to its anticonvulsant nature.  In addition, benzodiazepines should be minimized.  I would be happy to follow this patient during the remainder of her stay.  Thank you for asking this consultation-liaison psychiatry to see this interesting patient.     Anselm Jungling, MD     SPB/MEDQ  D:  09/24/2010  T:  09/24/2010  Job:  [id number]  Electronically Signed by Geralyn Flash MD on 09/25/2010 09:43:03 AM

## 2010-09-25 NOTE — Consult Note (Signed)
NAME:  Amber [name], Amber [name] NO.:  [id number]  MEDICAL RECORD NO.:  [id number]           PATIENT TYPE:  I  LOCATION:  4153                         FACILITY:  [facility]  PHYSICIAN:  Thana Farr, MD    DATE OF BIRTH:  06/05/1946  DATE OF CONSULTATION:  09/16/2010 DATE OF DISCHARGE:                                CONSULTATION   REASON FOR CONSULTATION:  Confusion.  HISTORY OF PRESENT ILLNESS:  This is a 65 year old female who was initially admitted to [facility] on September 15, 2010, for right-sided weakness and confusion.  Initially, there was question of code stroke; however, the patient's symptoms quickly resolved, however, confusion remained.  The patient was admitted to the hospital for further evaluation and eventually transferred to rehab.  On rehab, the patient's confusion continued and Neurology was asked to see the patient.  In conversation with sister, the patient has been cared for by her daughter at home.  She does cook and clean to a small degree, but does not take care of bills or any other household chores.  Daughter helps with ADLs at home.  Over the past 3-4 months, the patient has had difficulty with multiple falls; however, these have been described as the patient getting up without assistance as being directed by other physicians.  While in rehab, the patient has had waxing and waning mental status.  At times, the patient has been alert and knows where she is.  At other times during her hospital stay, she is confused that where she is and has difficulty staying on task.  PAST MEDICAL HISTORY: 1. Hypertension. 2. Hypercholesterolemia. 3. Bipolar. 4. Right total knee arthroplasty. 5. Hep C. 6. Liver failure. 7. CAD, status post MI, status post stenting. 8. Ischemic cardiomyopathy. 9. Right rotator cuff surgery arthroscopically. 10.Asthma. 11.Left eye retinal detachment.  MEDICATIONS:  While in the hospital include aspirin, Coreg,  Plavix, Lamictal, nicotine, Protonix, Zocor, K-Dur, Spiriva, Dulcolax, clonidine, Phenergan, low-molecular-weight heparin, and oxycodone 5 mg q.6 h. p.r.n.  ALLERGIES:  PENICILLIN, DARVOCET, and TYLENOL.  SOCIAL HISTORY:  The patient does smoke one pack per day.  Does not drink or do illicit drugs.  She does have a history of alcohol abuse. The patient lives with her daughter who does the majority of the care for the patient, however, she is stated to clean the house and able to cook in the past.  She does have a history of multiple falls even with her daughter taking care of her.  REVIEW OF SYSTEMS:  Positive for confusion, peripheral neuropathy, multiple falls, decreased vision, right-sided weakness, right-sided numbness and tingling, and headache.  PHYSICAL EXAMINATION:  GENERAL:  The patient is alert. NEUROLOGIC:  She is oriented to place only with help during the exam such as giving her three options to where she is.  She will state that she is at [facility].  She is aware that it is April, but has difficulty with the year.  The patient is able to carry out two-step commands without any difficulty but is slow with these steps.  The patient's right pupil is equal, round, and reactive to  light.  Her extraocular movements in her right are intact.  She shows no ptosis. Visual fields are intact with her right eye.  Face appears symmetrical. Tongue is midline.  Uvula is midline.  The patient has garbled speech. However, she does not have her dentures in at this time.  Facial sensation is intact bilaterally.  Shoulder shrug and head turn is intact.  She is in a left abduction pillow secondary to shoulder surgery at this time.  Coordination with her right hand with finger-to-nose is smooth.  Heel-to-shin, the patient had difficulty taking part in this task secondary to range of motion and strength.  Gait was not tested at this time.  The patient's motor showed to be 4/5  strength in her right upper extremity.  Bilateral plantar flexion, dorsiflexion, bilateral knee extension and flexion shows 3+ strength with her hip flexion and abduction.  Deep tendon reflexes were 2+ throughout.  I noticed downgoing toes bilaterally.  I did not notice drift in her right upper extremity.  Bilateral lower extremities were weak, showing drift down. The patient showed bilateral lower extremity peripheral neuropathy with light touch and pinprick, otherwise intact. PULMONARY:  Clear to auscultation. CARDIOVASCULAR:  S1 and S2 are audible. NECK:  Negative for bruits.  LABORATORY DATA:  Sodium is 137, potassium 4.5, chloride 105, CO2 of 26, BUN 12, creatinine 0.71, glucose 95.  White blood cell count 9.0, platelets 188, hemoglobin 12.7, hematocrit 39.2.  Ammonia was 26.  HbA1c was 6.1.  TSH was 1.84.  IMAGING:  MRI was negative for acute stroke, however, did show positive atrophy and bilateral posterior horn white matter disease.  ASSESSMENT: 1. This is a pleasant 65 year old African American female with history     of multiple falls that have been more frequent for the past 3 months and confusion for the past 2 weeks,     which has been waxing and waning which may very well represent a contribution of medication.  This is the clinical scenario of a baseline mental status that is compromised by an underlying neurodegenerative process secondary to longstanding     alcoholism and age. 2. The patient's decreased balance is most likely secondary to     peripheral neuropathy along with decreased vision.  RECOMMENDATIONS:  Recommendations at this time are: 1. Use a walker or cane at all times for balance.  This will be     difficult at present time secondary to the patient having recently     undergone shoulder surgery. 2. Would recommend continuing aspirin and Plavix. 3. Recommend keeping narcotic use down to minimum, however, the     patient will need these medications to  some degree to      continue with her rehabilitation of her left shoulder.  \ 4. Would also recommend EEG to rule out any seizure or postictal issues.    I have discussed these findings with Dr. Thad Ranger, and she is in     agreement.  Thank you very much for this consultation.     Felicie Morn, PA-C   ______________________________ Thana Farr, MD    DS/MEDQ  D:  09/16/2010  T:  09/17/2010  Job:  [id number]  Electronically Signed by Felicie Morn PA-C on 09/17/2010 02:39:20 PM Electronically Signed by Thana Farr MD on 09/25/2010 11:38:07 AM

## 2010-09-26 DIAGNOSIS — F05 Delirium due to known physiological condition: Secondary | ICD-10-CM

## 2010-09-26 LAB — VITAMIN B1: Vitamin B1 (Thiamine): 13 nmol/L (ref 9–44)

## 2010-09-27 DIAGNOSIS — F05 Delirium due to known physiological condition: Secondary | ICD-10-CM

## 2010-09-30 NOTE — Discharge Summary (Signed)
NAME:  Amber [name], SANPEDRO NO.:  [id number]  MEDICAL RECORD NO.:  [id number]           PATIENT TYPE:  I  LOCATION:  4153                         FACILITY:  [facility]  PHYSICIAN:  Pearlean Brownie, M.D.DATE OF BIRTH:  December 21, 1945  DATE OF ADMISSION:  09/11/2010 DATE OF DISCHARGE:  09/15/2010                              DISCHARGE SUMMARY   ATTENDING DOCTOR:  Pearlean Brownie, MD  PRIMARY CARE PHYSICIAN:  Unassigned, PCP in [place], [place].  DISCHARGE DIAGNOSES: 1. Cerebral ischemia/transient ischemic attack with residual problems     with balance and right hemiparesis. 2. Pain management status post left shoulder surgery. 3. History of recurrent falls. 4. Right lower extremity skin abrasion. 5. History of congestive heart failure. 6. History of hepatitis C and liver cirrhosis.  DISCHARGE MEDICATIONS: 1. Hydrochlorothiazide 12.5 mg 1 tablet by mouth daily. 2. Oxycodone 5 mg immediate release 1 tablet by mouth every 6 hours as     needed for pain, take for 14 more days. 3. K-Dur 10 mEq 1 tablet by mouth daily. 4. Trazodone 50 mg 1-2 tablets by mouth daily at bedtime as needed for     insomnia. 5. Aspirin 1 tablet by mouth daily. 6. Combivent 1 puff inhaled every 6 hours as needed for shortness of     breath. 7. Coreg 25 mg 1 tablet by mouth daily. 8. Lamictal 200 mg 1 tablet by mouth twice daily. 9. Nexium 40 mg 1 capsule by mouth daily. 10.Nitroglycerin sublingual 0.4 mg 1 tablet under tongue every 5     minutes up to 3 days as needed for chest pain. 11.Plavix 75 mg 1 tablet by mouth daily. 12.Simvastatin 40 mg 1 tablet by mouth daily. 13.Spiriva 18 mcg 1 capsule inhaled daily. 14.Vitamin D2 50,000 units 1 tablet by mouth weekly.  STOP TAKING THE FOLLOWING MEDICATIONS:  Oxycodone 10 one tablet by mouth four times a day.  CONSULTS:  Physical medicine and rehabilitation.  PROCEDURES WITH DATES:  September 10, 2010, a CT head without  contrast: Stable.  No acute intracranial abnormality. September 11, 2010, CT angiography head and neck:  Mild narrowing proximal right innominate artery, right subclavian artery and left subclavian artery. Mild narrowing proximal nondominant right vertebral artery, mild irregularity throughout the course of the cervical segment of the right vertebral artery. No evidence of hemodynamically significant stenosis involving the proximal left ICA. Moderate narrowing right internal carotid artery cavernous segment. Moderate narrowing left internal carotid artery to treat segment and cavernous segment. Right periatrial/parietal region hyperdensity may be old, but cannot exclude acute stroke. On September 16, 2010, an MRI head without contrast:  Atrophy and small vessel disease.  No acute intracranial findings.  PERTINENT LABORATORY DATA AT DISCHARGE:  BMET, sodium 139, potassium 3.7, chloride 105, CO2 of 28, BUN 13, creatinine 3.78, glucose 115. CBC, white count 5.9, hemoglobin 11.5, hematocrit 36.5, platelet 171. RPR nonreactive.  Folate 15.5.  Vitamin B12 346, hemoglobin A1c 6.1. TSH 1.844.  Lipid profile, cholesterol 195, triglycerides 122, HDL 70, LDL 101.  Cardiac enzymes negative x2.  UDS positive for opiates and benzodiazepines.  Alcohol less than 5.  HOSPITAL COURSE:  This is a 65 year old female with a past medical history of hypertension and cardiac disease who presented with right upper and lower extremity weakness, altered mental status, slurred speech.  The patient also has a history of recurrent falls 3 days prior to admission and patient had undergone left rotator cuff surgery and was given narcotics as needed for pain.  In the emergency department, the nurse gave one dose of Narcan which improved the patient's mental status significantly. 1. TIA/cerebral ischemia.  The patient was seen by the stroke team in     the ER.  At the time of admission, the patient's symptoms of right-      sided hemiparesis, dysarthria, and altered mental status had     improved significantly.  Code stroke was cancelled.  A CT of the     head was obtained which showed no acute infarct.  The patient was     admitted to telemetry.  A 2-D echo was obtained which showed an EF     of 55-60%.  The patient was continued on her Plavix and statin.     PT/OT consults were appreciated.  Both recommended CIR and believes     that the patient was a good candidate for inpatient rehab.  The     patient was seen by the CIR team who agreed with this plan.  I     spoke with the neurologist on call before transferring to CIR to     read these results of the CT angio of head and neck.  Neurologist     recommended an MRI without contrast secondary to the narrowing of     the right ICA. MRI was negative for any acute abnormality.  The     patient will be transferred to inpatient rehab in stable medical     condition. 2. Acute pain secondary to recent left rotator cuff surgery.  The     patient's home dose of oxycodone was decreased to 5 mg by mouth     every 6 hours as needed for pain.  Physical Therapy recommended to     place patient's left arm in a sling and to continue to use the     sling in a limited range of motion until postop day #10.     Additionally, patient's orthopedic surgeon at [facility] was     called who recommended that patient's sutures be removed on postop     day #3.  The patient to be transferred to CIR and nurses to remove     sutures as directed.  We will give patient a prescription for     oxycodone 5 every 6 hours as needed for pain for 2 weeks.  The     patient is to follow up with the orthopedic surgeon for further     recommendations and follow up for further recommendations and     evaluation status post surgery 3. History of hepatitis C.  Liver enzymes were baseline.  The patient     needs to schedule an appointment and followup appointment with PCP     regarding this  chronic issue. 4. Hypertension.  The patient was continued on her home dose of Coreg,     however, blood pressures continued to be elevated.  We started the     patient on high HCTZ 25 mg p.o. daily.  The patient is to follow up     with her PCP, for if this is a chronic  issues. 5. History of recurrent falls.  Physical Therapy and Occupational     Therapy recommended inpatient rehab or deconditioning. 6. History of congestive heart failure, avoided any intravenous fluids     in this setting of congestive heart failure.  Continue patient on     her medical management. 7. Right lower extremity lateral skin abrasion.  Instructions to     change dressing daily and monitor for any signs of infection.  The     patient did not spike any fevers nor did she have any signs of     cellulitis or infection from her wound.  At the time of discharge,     the abrasion was like it was healing well.  This is to be followed     while patient is in CIR.  CODE STATUS:  The patient was initially DNR/DNI when she first was admitted, however, after speaking to the patient and her daughter, they decided that this was never the case and that the patient was to be full code.  DISCHARGE INSTRUCTIONS:  Activity:  Ad lib per inpatient rehab.  RECOMMENDATIONS:  Diet:  Heart-healthy. Wound care:  Wet-to-dry daily dressings on right-sided thigh skin abrasion.  SPECIAL INSTRUCTIONS:  Please avoid straining.  Please avoid any activity that causes excessive weakness, chest pain, nausea, and vomiting.  FOLLOWUP APPOINTMENTS:  Inpatient rehab to schedule an appointment with patient's PCP in [place] 1-2 weeks after being discharged from CIR.  DISCHARGE CONDITION:  The patient was discharged to CIR in stable medical condition.    ______________________________ Barnabas Lister, MD   ______________________________ Pearlean Brownie, M.D.    ID/MEDQ  D:  09/16/2010  T:  09/17/2010  Job:   [id number]  Electronically Signed by Barnabas Lister MD on 09/25/2010 04:16:27 PM Electronically Signed by Pearlean Brownie M.D. on 09/30/2010 02:26:08 PM

## 2010-10-01 NOTE — H&P (Signed)
NAME:  Amber [name], Amber [name] NO.:  [id number]  MEDICAL RECORD NO.:  [id number]           PATIENT TYPE:  I  LOCATION:  5522                         FACILITY:  [facility]  PHYSICIAN:  Genisis Sonnier A. Sheffield Slider, M.D.    DATE OF BIRTH:  1945-06-24  DATE OF ADMISSION:  09/23/2010 DATE OF DISCHARGE:                             HISTORY & PHYSICAL   DATE OF TRANSFER:  September 23, 2010.  PRIMARY CARE PROVIDER:  PCP in [place].  SERVICE BEING TRANSFERRED FROM:  Inpatient rehab.  HISTORY OF PRESENT ILLNESS:  This is a 65 year old African American female with extensive past medical history, currently in [facility] for comprehensive rehabilitation after left rotator cuff surgery and multiple recent falls and decline in physical abilities. While in the inpatient rehab service, the patient's altered mental status acutely worsened on April 9.  The patient was unable to participate in physical therapy and occupational therapy.  In addition, the patient was very agitated, hallucinating, paranoid and very difficult to calm down.  Nursing staff and physicians felt that the patient would be better suited on a medical floor rather than inpatient rehabilitation since the patient was not able to participate in any of the goals of rehabilitation.  PAST MEDICAL HISTORY: 1. Hep C with cirrhosis and encephalopathy. 2. Cerebral ischemia/TIA with residual right hemiparesis. 3. Hypertension. 4. Hyperlipidemia. 5. Coronary artery disease status post MI and stent. 6. Ischemic cardiomyopathy. 7. Bipolar disorder. 8. Asthma. 9. CHF. 10.Vascular dementia. 11.History of recurrent falls.  TRANSFER MEDICATIONS: 1. Aspirin 325 mg p.o. daily. 2. Coreg 25 mg p.o. b.i.d. 3. Plavix 75 mg p.o. daily. 4. Clotrimazole 10 mg dissolvable tabs 4 times a day. 5. Lovenox 40 mg subcu daily. 6. Fosfomycin 3 g p.o. every other day x3 doses. 7. Gabapentin 300 mg p.o. nightly. 8. Lactulose 30 mL p.o.  b.i.d. 9. Lamictal 100 mg p.o. b.i.d. 10.Nicotine 21 mg patch transdermal daily. 11.Seroquel 25 mg p.o. daily. 12.Zocor 40 mg p.o. daily. 13.Spironolactone 25 mg p.o. daily. 14.Spiriva 18 mcg inhaled daily.  ALLERGIES:  PENICILLIN, DARVOCET and TYLENOL.  SURGICAL HISTORY:  Left rotator cuff surgery in the beginning of April 2012.  SOCIAL HISTORY:  The patient lives with her daughter in a 1-level home with no steps to entry.  The patient denies smoking or drinking.  She is modified independent prior to hospital admission.  FUNCTIONAL HISTORY:  The patient is modified independent.  PHYSICAL EXAMINATION:  VITAL SIGNS:  On admission, the patient was afebrile at 98.6, blood pressure 164/96, pulse  73, respiratory rate of 16, satting 99% on room air. GENERAL:  The patient was sitting in bed, very confused and agitated actively, hallucinating and very paranoid. HEENT:  Moist mucous membranes.  Extraocular movements intact in the right eye.  Left eye is closed. CARDIOVASCULAR:  Regular rate and rhythm. PULMONARY:  Lungs were clear to auscultation bilaterally. ABDOMEN:  Soft, nontender. EXTREMITIES:  Stitches firm recent shoulder surgery in left shoulder. Area does not appear to be erythematous or infected.  ASSESSMENT AND PLAN:  This is a 65 year old female, being transferred from inpatient rehab to [facility]  for increased altered mental status and agitation and inability to participate in rehabilitation program. 1. Altered mental status.  This is likely multifactorial including     hepatic encephalopathy, prior existing bipolar disorder, and     vascular disease.  The patient was started on Seroquel 25 mg p.o.     daily just prior to transfer.  We will monitor the patient's     response to Seroquel, it could be titrated up as this is a low dose     starting out, lactulose will be continued.  In addition, the     patient was found to have a UTI, which may  possibly be a     contributing factor; however, this is more likely sterile pyuria in     colonization, however, fosfomycin every other day for 3 doses will     be given to help clear this possible contributing urinary tract     infection.  Lamictal dose was decreased the day prior to transfer     and Ativan and Librium were discontinued as they do not appear to     be helping with the patient's agitation and was only according to     situation.  We will continue monitoring the patient.  She will     require 24-hour supervision by a family member.  If there is no     family member present, then the patient will require a sitter.  At     this time, the patient does not have IV access as she is very     combative and will not allow an IV to be started after her     previously established IV became unusable. 2. Chronic medical problems.  We will continue the patient's current     therapy for her hypertension, hyperlipidemia, thrush, coronary     disease, and asthma including aspirin, Coreg, Plavix, clotrimazole,     Lovenox, gabapentin, nicotine patch, Zocor, spironolactone, and     Spiriva 3. Fluids, electrolytes, nutrition/gastrointestinal.  We will obtain     an IV.  We are able, the patient is to continue on dysphagia II     diet per Nutrition recommendations, however, at goals of care     family meeting on the day of transfer.  The family was requesting     no restrictions on diet.  They were agitated and verbalized     understanding that the patient is at increased risk for choking,     aspiration, and pneumonia.  Family is receptive to a feeding tube     in the future if indicated, however, at this time we will just     continue p.o. dysphagia II diet. 4. Prophylaxis.  Lovenox.  DISPOSITION:  Goals of care meeting with family revealed that the patient remains full code status with active management of medical problems including dementia, encephalopathy, UTI and dysphasia.  We  will follow up Psych consult for their recommendations for paranoia, anxiety and dementia.  Family plans to take the patient home when medically and mentally stable.  Family does not wish for there to be any placement or hospice at this time.    ______________________________ Demetria Pore, MD   ______________________________ Arnette Norris. Sheffield Slider, M.D.    JM/MEDQ  D:  09/24/2010  T:  09/25/2010  Job:  [id number]  Electronically Signed by Demetria Pore MD on 09/30/2010 08:33:22 AM Electronically Signed by Zachery Dauer M.D. on 10/01/2010 04:55:55 AM

## 2010-10-01 NOTE — Discharge Summary (Signed)
NAME:  Amber [name], Amber [name] NO.:  [id number]  MEDICAL RECORD NO.:  [id number]           PATIENT TYPE:  I  LOCATION:  5522                         FACILITY:  [facility]  PHYSICIAN:  Amber [name], M.D.    DATE OF BIRTH:  1946-02-14  DATE OF ADMISSION:  09/23/2010 DATE OF DISCHARGE:  09/27/2010                              DISCHARGE SUMMARY   PRIMARY CARE PROVIDER:  Sherrie Mustache, MD in [place], [place].  DISCHARGE DIAGNOSES: 1. Altered mental status likely not multifactorial component. 2. Anxiety and depression with component of paranoia. 3. Bipolar disorder. 4. Dysphasia. 5. History of hepatitis C with cirrhosis and encephalopathy. 6. Cerebral ischemia/transient ischemic attack with residual right     hemiparesis. 7. Hypertension. 8. Hyperlipidemia. 9. Coronary artery disease status post myocardial infarction and     stent. 10.Ischemic cardiomyopathy. 11.Asthma. 12.Congestive heart failure. 13.Vascular dementia. 14.History of recurrent falls. 15.Deconditioning. 16.Acute on chronic pain.  DISCHARGE MEDICATIONS: 1. Artificial Tears 1 drop in each eye b.i.d. 2. Carvedilol 25 mg p.o. b.i.d. with meals. 3. Clotrimazole 10 mg dissolvable tablet q.i.d. 4. Gabapentin 300 mg p.o. at bedtime. 5. Ibuprofen 600 mg p.o. q.6 h. p.r.n. severe pain. 6. Lactulose 30 mL p.o. daily. 7. Lamictal 100 mg p.o. b.i.d. 8. Lidoderm patch 1 to 3 patches topically daily p.r.n. pain. 9. Seroquel 50 mg p.o. at bedtime. 10.Spironolactone 25 mg p.o. daily. 11.Aspirin 81 mg p.o. daily. 12.Combivent 1 puff q.6 h. p.r.n. 13.Nexium 40 mg p.o. daily. 14.Nitroglycerin 0.4 mg 1 tablet under tongue q.5-minute p.r.n. up to     three doses. 15.Oxycodone 5 mg p.o. q.6 h. p.r.n. severe pain. 16.Plavix 75 mg p.o. daily. 17.Simvastatin 40 mg p.o. daily. 18.Spiriva 80 mcg inhaled daily. 19.Vitamin D2 50,000 units p.o. weekly.  Medications which were stopped include: 1. Carvedilol 25 mg  p.o. daily. 2. Lamictal 200 mg p.o. b.i.d. 3. Hydrochlorothiazide 12.5 mg p.o. daily. 4. KCl 10 mEq p.o. daily. 5. Trazodone 25-50 mg p.o. at bedtime p.r.n.  CONSULTS: 1. Psychiatry. 2. Palliative Care.  PROCEDURES:  None.  LABORATORY DATA:  On admission to our service, the patient's CBC was stable with white count of 8.4, hemoglobin 10.8.  BMET was stable with potassium of 3.4, creatinine of 0.74, albumin of 2.9, otherwise normal total bilirubin, alk phos, AST, ALT, total protein.  The patient's prealbumin was 11.0.  No other labs were drawn during this particular admission, however, while on the rehab service, the patient's vitamin B1 was found to be 13, ammonia level 23, and urinalysis showing moderate bilirubin, 15 ketones, 30 protein, positive nitrite, small leukocytes, 0- 2 white blood cells, few bacteria, and urine culture which grew E. coli and Enterococcus.  BRIEF HOSPITAL COURSE:  This is a 65 year old female with multiple medical problems who was transferred back to the medical service from inpatient rehabilitation service for increasing altered mental status and agitation. 1. Altered mental status.  At the time of transfer, ammonia level was     within normal limits.  It is thought that this altered mental     status is likely multifactorial with a combination of hepatic  encephalopathy in addition to vascular dementia and the patient's     preexisting bipolar disorder as well as over medication and a     question of Lewy body dementia.  In addition, the patient was found     to have urinary tract infection versus colonization and was treated     for this.  See below for further details.  Initially it was thought     that the patient's altered mental status may also have a component     of dehydration, however, despite vigorous rehydration with IV     fluids, the patient remained agitated, altered, and confused and     was having obvious visual and auditory  hallucinations.  It was     decided to decrease the patient's Lamictal dose.  Initially Ativan     was tried, however, this was quickly discontinued and Librium was     started for agitation, which was also discontinued as it did not     seem to assist the patient in any way.  The patient's lactulose had     also initially been increased, but they thought that this may be     due to hepatic encephalopathy however, it is thought that this was     less of a component.  The patient's INR remained within normal     limits, therefore the patient does have some hepatic dysfunction and     increase in the lactulose did not help.  The patient's lactulose     was decreased back down to once a day prior to discharge.  Seroquel     25 mg daily was started and a huge noticeable improvement was seen     within 12 hours of the first dose.  Psychiatry had already been     consulted at this time who agreed with Seroquel and decreased the     dose to 50 mg at bedtime prior to discharge.  Psychiatry agreed     with decreasing Lamictal slowly as well as minimizing     benzodiazepines.  The patient should continue on the Seroquel.  The patient's PCP will need to follow up with metabolic panel as well     as CBGs as an outpatient. 2. Urinary tract infection.  It is unclear as to whether this was     necessarily an acute infection versus chronic colonization;     however, the patient was started on Fosfomycin every other day x3     total doses for treatment.  The patient remained afebrile with     vital signs stable. 3. Left shoulder pain.  The patient had recent left shoulder surgery     by Orthopedics.  Stitches were removed once the patient was     transferred back onto our service.  Heat and Lidoderm patches were     encouraged as first line treatment as the patient is allergic to     Tylenol and ibuprofen use should be discouraged with the patient's     hepatic failure.  However, the patient was sent home  with     instructions that she could use ibuprofen or oxycodone.  Oxycodone     was also discouraged as the patient has a history of narcotic     abuse. 4. Hypertension.  The patient's Coreg was increased to 25 mg b.i.d.     Hydrochlorothiazide was stopped and spironolactone was also added.     This appears to control her blood  pressures adequately and they are     in the one-teens to 120s prior to discharge. 5. Dysphagia.  Nutrition and Speech Therapy were consulted and the     patient was discharged home on dysphagia II diet; however, during a     family meeting the patient emphasized that they would like to have     no food restrictions.  It was discussed in depth the risk for     aspiration and thus an aspiration pneumonia as a result of this,     however, family was adamant to continue with no dietary     restrictions.  The patient was continued in-house on a dysphagia II     diet; however, family day continued to bring outside meals. 6. Deconditioning and recurrent falls.  PT/OT recommendations were for     a SNF rehabilitation, however, family refused this.  The patient will be     discharged home with home health PT/OT for assistance.  DISCHARGE INSTRUCTIONS:  The patient is to follow up with her primary care provider within 1 month.  Home health physical therapy was set up prior to discharge.  The patient was also instructed not to walk without her walker or without assistance in order to decrease her risk for falls.  DISCHARGE CONDITION:  The patient was discharged home with family in stable medical and mental condition with home health PT.    ______________________________ Demetria Pore, MD   ______________________________ Amber [name], M.D.    JM/MEDQ  D:  09/29/2010  T:  09/30/2010  Job:  [id number]  cc:   Sherrie Mustache, M.D.  Electronically Signed by Demetria Pore MD on 09/30/2010 08:33:59 AM Electronically Signed by Zachery Dauer M.D. on 10/01/2010 04:59:00  AM

## 2010-10-09 NOTE — Consult Note (Signed)
NAME:  Amber [name], Amber [name] NO.:  [id number]  MEDICAL RECORD NO.:  [id number]           PATIENT TYPE:  LOCATION:                                 FACILITY:  PHYSICIAN:  Edsel Petrin, D.O.DATE OF BIRTH:  [birth year]  DATE OF CONSULTATION:  09/23/2010 DATE OF DISCHARGE:                                CONSULTATION   REQUESTING PHYSICIAN:  Erick Colace, MD  COLLABORATING PHYSICIAN:  Edsel Petrin, DO  REASON FOR CONSULTATION:  Establish goals of care and symptom management.  This nurse practitioner, Dorian Pod, received report from the team, reviewed medical records, examined the patient, then met with the patient's family including her daughter and healthcare power of attorney, Rene Kocher, [phone]; son, Kathlene November, from [place], [phone]; sister, Zollie Scale, who goes by Marcelino Duster; and sister, Fulton Mole, who goes by [employer].  Not present was the patient's adult daughter, Bradly Chris, her number is [phone].  Also present was the rehab team, including social work.  A detailed discussion was had regarding advanced directives with concept specific to the difference between an aggressive medical interventions path versus a palliative comfort care path for this patient at this time in her current situation.  Time in 11:30, time out 1 p.m. with a 75- minute consultation, greater than 50% of this time spent in medical counseling, coordination of care, and discussion of the pathophysiology of the patient's disease process.  Problem list at this time is as follows: 1. Altered mental status in the setting of dementia, mental illness,     encephalopathy, urinary tract infection, dehydration, and     polysubstance abuse. 2. Anxiety, agitation, paranoia, and depression. 3. Dysphasia. 4. Immobility and deconditioning. 5. Palliative performance score 30% currently. 6. Psychosocial issues. 7. Safety issues. 8. Recurrent falls. 9. Alteration in sensorium. 10.Acute on  chronic pain.  Goals of care at this time is established by the patient's daughter, son and her sisters. 1. Maintain a full code status. 2. Family is resistant to passive management of multiple medical     problems as discussed, including dementia, encephalopathy,     rehabilitation, infections, and dysphasia.  They are seeking     aggressive care. 3. Nutrition.  The patient with positive dysphasia.  Speech Therapy is     recommending  dysphasia diet.  Family is requesting no restrictions     on diet.  They have been educated and verbalized understanding that     the patient is increased risk for choking, aspiration pneumonia.     Family is receptive to tube feeding in future if indicated. 4. Psychosocial concerns.  Family plans to take the patient home when     stable and insisted they will have adequate family support to     manage the patient. 5. Psychiatry.  The patient needs a psychiatric evaluation for her     paranoia, anxiety, dementia, and history of mental illness.  I     suspect a significant amount of her symptoms at this time are     coming from her severe atrophy as noted on her CT scan with     dementia. 6. I recommend  moving the patient to medical floor as soon as     possible, stabilize her psychiatric issues.  The patient is not     appropriate for rehabilitation at this time.  Family would be     receptive to discharging to SNF for rehab short-term, but once     again state they intend to take the patient home.  We had a long     discussion with family regarding the patient's multiple medical and     psychiatric issues and the fact that the patient's cognitive     function has been affected by the infection, liver disease, her     dementia, and electrolytes imbalance.  The patient will most likely     continue to decline with cycles of hospitalizations.  The patient's     family is not receptive to the idea of placement or hospice at this     time.  We will  continue to follow along a distance for emotional     support for the patient and family have requested.  Thank you for the opportunity to assist with this patient's care.     Dorian Pod, ACNP   ______________________________ Edsel Petrin, D.O.    MB/MEDQ  D:  09/24/2010  T:  09/25/2010  Job:  [id number]  cc:   Erick Colace, M.D. Wayne A. Sheffield Slider, M.D.  Electronically Signed by Dorian Pod ACNP on 10/06/2010 09:29:33 AM Electronically Signed by Anderson Malta D.O. on 10/09/2010 09:28:45 PM

## 2010-10-10 NOTE — Discharge Summary (Signed)
NAME:  Amber [name], Amber [name] NO.:  [id number]  MEDICAL RECORD NO.:  [id number]           PATIENT TYPE:  I  LOCATION:  4140                         FACILITY:  [facility]  PHYSICIAN:  Erick Colace, M.D.DATE OF BIRTH:  [birth year]  DATE OF ADMISSION:  09/15/2010 DATE OF DISCHARGE:  09/23/2010                              DISCHARGE SUMMARY   DISCHARGE DIAGNOSES: 1. Suspect cerebrovascular accident. 2. Vascular dementia. 3. History of recurrent falls. 4. Congestive heart failure. 5. Hepatic encephalopathy.  HISTORY:  A 65 year old African American female with hepatitis C, cirrhosis, medical noncompliance, admitted at [facility] with altered mental, falls and recent decline and failure to thrive.  CT of the head was negative for acute changes.  CT angiogram unremarkable. Carotid Dopplers had not been completed.  Placed on aspirin and Plavix therapy at that time for suspect transient ischemic attack.  She had a left upper extremity sling in place for recent rotator cuff surgery. She was admitted for comprehensive rehab program.  PAST MEDICAL HISTORY:  See discharge diagnoses.  SOCIAL HISTORY:  Lives with her daughter, 1-level home.  No steps to entry.  She does not smoke or drink.  She is modified independent prior to hospital admission.  MEDICATIONS PRIOR TO ADMISSION:  Were unknown as the patient noted medical noncompliance.  PHYSICAL EXAMINATION:  VITAL SIGNS:  Blood pressure 172/99, pulse 71, respirations 16, temperature 98. GENERAL:  This was a patient sitting up in bed, uncomfortable, complaining of headache, eyes remained closed. ENT EXAM:  Unremarkable. NECK:  Supple. HEART:  Regular rate rhythm. ABDOMEN:  Soft, nontender. LUNGS:  Clear to auscultation. She was somewhat noncompliant with the physical exam with bouts of agitation.  Judgment, orientation and memory were all abnormal.  REHAB HOSPITAL COURSE:  The patient was admitted to  inpatient rehab services with therapies initiated on a 3-hour daily basis consisting of physical therapy, occupational therapy, speech therapy and rehabilitation nursing, the following issues were addressed during the patient's rehabilitation stay.  Pertaining to [name]' suspect stroke, CT of the head, MRI of the brain followup October 13, 2010, again unremarkable other than showing moderate to severe atrophy, advanced chronic microvascular ischemic changes.  No acute changes were noted. She remained on aspirin and Plavix therapy.  She did have a history of hepatic encephalopathy.  Her lactulose was resumed, it was noted that she was noncompliant with this at home.  Her latest ammonia level was 18 versus 26.  Urinalysis study all negative.  Noted ongoing bouts of agitation, restlessness, attempts to Risperdal was somewhat sedating to the patient this was discontinued.  Trials of Seroquel were added.  The patient with ongoing bouts of agitation, restlessness all issues in regards this were discussed with family, Palliative Care did become involved to discuss goals of care in relation to her latest workup for stroke that did indicate progressive atrophy and moderate to severe suspect dementia.  Her therapies remained limited due to patient not able to participate with program due to her agitation and restlessness, it was felt she would not be able to progress well enough on the Rehab Unit, would need  ongoing medical care, thus she was discharged to the services of [facility] for ongoing care at this time.  All issues in regards to this were discussed with family.  All medication changes would be made per teaching service.  Psychiatry Service had been consulted in regards to her delirium dementia.     Amber [name], P.A.   ______________________________ Erick Colace, M.D.    DA/MEDQ  D:  09/24/2010  T:  09/24/2010  Job:  [id number]  Electronically  Signed by Amber [name] P.A. on 09/26/2010 01:05:24 PM Electronically Signed by Claudette Laws M.D. on 10/10/2010 14:78:29 AM

## 2010-10-13 NOTE — H&P (Signed)
NAME:  Amber [name], PILLING NO.:  [id number]  MEDICAL RECORD NO.:  [id number]          PATIENT TYPE:  INP  LOCATION:                               FACILITY:  [facility]  PHYSICIAN:  Paula Compton, MD        DATE OF BIRTH:  February 14, 1946  DATE OF ADMISSION: DATE OF DISCHARGE:                             HISTORY & PHYSICAL   PRIMARY CARE PHYSICIAN:  Unassigned, provider is in [place], [place].  CHIEF COMPLAINT:  Right-sided weakness and frequent falls.  HISTORY OF PRESENT ILLNESS:  The patient is a 65 year old female with past medical history of high blood pressure and cardiac disease who presented to the ER today for right upper and lower extremity weakness and disorientation, brought from [facility] to [facility] because of concern for code stroke.  The patient was evaluated in the ER and while waiting symptoms of right upper and lower extremity weakness and slurred speech and disorientation began to resolve.  Per sister, her speaking is now back to normal and the weakness in the right leg almost completely resolved.  The patient's sister also reports that the patient usually falls multiple times per day.  Sister is concerned about her health status.  On discussion of the case with ER nurse, RN states that her disorientation improved some after one dose of Narcan.  There was concern that some of the altered mental status was secondary to pain medication she was taking for her left rotator cuff injury.  REVIEW OF SYSTEMS:  No fever or decrease in appetite.  No chest pain, no shortness of breath, no dysphagia.  Positive frequent falls daily and positive occasional confusion per family.  ALLERGIES:  PENICILLIN, DARVOCET, TYLENOL.  PAST MEDICAL HISTORY: 1. Atypical chest pain. 2. CAD status post MI, April 01, 2008, stent in LAD with restenosis     of LAD in April 13, 2008. 3. Ischemic cardiomyopathy 55-70% in July 2010. 4. Hyperlipidemia. 5.  Hypertension. 6. Hep C/liver cirrhosis. 7. Bipolar. 8. Status post right total knee. 9. Right rotator cuff replacement. 10.Asthma. 11.Left eye retinal detachment removal.  MEDICATION LIST:  Unknown.  The patient does not have meds with her. Pharmacy to reconcile medications.  SOCIAL HISTORY:  The patient lives with daughters.  She has multiple falls each day.  Refused rehab placement in past.  Smokes 1 pack per day.  No alcohol.  No drugs.  FAMILY HISTORY:  Noncontributory.  ASSESSMENT AND PLAN:  The patient is a 65 year old female with multiple past medical history problems who presents with right-sided weakness and confusion that is now resolving, also having daily falls. 1. Right-sided weakness, confusion.  CT showed no new changes.     Symptoms nearly completely resolved at the time of admission.  Echo     pending to evaluate heart.  Carotid Dopplers to evaluate     vasculature.  We will consider MRI of brain in a.m. if any residual     symptoms.  Also, since the patient improved with Narcan, these     symptoms may have been caused by narcotic use status post left  shoulder surgery. 2. Falls.  Physical therapy/occupational therapy consult placed to see     if the patient qualified for rehab. 3. Right thigh lateral skin tear.  No fever.  Only local thigh     erythema.  We do not feel that it is infected at this time, but yet     we need to watch closely for cellulitis. 4. History of congestive heart failure.  We will avoid extra     intravenous fluids in the setting of congestive heart failure. 5. Hepatitis C and liver cirrhosis.  Unsure who follows these issues.    pt to see PCP who should good follow up for these issues. 6. Prophylaxis.  Early ambulation and Lovenox. 7. Code status.  DNR. 8. Disposition pending clinical improvement.     Ellin Mayhew, MD   ______________________________ Paula Compton, MD    DC/MEDQ  D:  09/11/2010  T:  09/11/2010  Job:   [id number]  Electronically Signed by Ellin Mayhew  on 09/30/2010 02:29:05 PM Electronically Signed by Paula Compton MD on 10/13/2010 04:52:31 PM

## 2010-10-15 NOTE — H&P (Signed)
NAME:  Amber [name], Amber [name] NO.:  [id number]  MEDICAL RECORD NO.:  [id number]           PATIENT TYPE:  I  LOCATION:  4153                         FACILITY:  [facility]  PHYSICIAN:  Ranelle Oyster, M.D.DATE OF BIRTH:  Mar 14, 1946  DATE OF ADMISSION:  09/15/2010 DATE OF DISCHARGE:                             HISTORY & PHYSICAL   CHIEF COMPLAINT:  Weakness, decreased balance.  PRIMARY CARE PHYSICIAN:  Sherrie Mustache, M.D. in [place].  HISTORY OF PRESENT ILLNESS:  This is a 65 year old African American female with hepatitis C, cirrhosis with medical history of medical noncompliance, admitted to [facility] for altered mental status and falls.  CT was negative for acute changes.  CT angio of the neck showed mild narrowing of the proximate right innominate artery. Carotid Dopplers not completed yet.  She is placed on increased dose of aspirin along with Plavix and subcu Lovenox for suspected TIA.  She is in a left upper extremity sling for recent rotator cuff surgery.  MRI is planned today with the patient complaining of headaches and blood pressure being elevated as well.  Rehab was consulted and felt she could benefit from a stay pending medical stability.  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  Full 12-point review is in the written H and P.  PAST MEDICAL HISTORY:  Positive for CAD, nonischemic cardiomyopathy, PTCA in 2009, hyperlipidemia, hypertension, bipolar disorder, depression, asthma, hepatitis C, liver cirrhosis, encephalopathy, eye surgery, right total knee replacement, left rotator cuff surgery 1 week ago.  FAMILY HISTORY:  Positive for CAD.  SOCIAL HISTORY:  The patient lives with daughter.  Daughter can assist except for limited lifting, lives in 1-level house with no steps to enter.  The patient does smoke, but does not drink.  FUNCTIONAL HISTORY:  The  patient is modified independent.  ALLERGIES:  PENICILLIN, DARVOCET, TYLENOL.  MEDICATION AND LABS:  Please see written H and P.  PHYSICAL EXAMINATION:  VITAL SIGNS:  Blood pressure 172/99, pulse 71, respiratory rate 16, temperature 98. GENERAL:  The patient is sitting in bed, uncomfortable, complaining of headache.  Eyes are closed. HEENT:  Pupils equally round and reactive to light.  Ear, nose, and throat exam is notable for edentulous mouth with thrush on the tongue. NECK:  Supple without JVD or lymphadenopathy. CHEST:  Clear to auscultation bilaterally without wheezes, rales, or rhonchi. HEART:  Regular rate and rhythm without murmurs, rubs, or gallops. ABDOMEN:  Soft, nontender.  Bowel sounds are positive. SKIN:  Notable for intact skin throughout on examination today. NEUROLOGIC:  Cranial nerves II-XII notable for some right-sided facial weakness, spatial slurring.  Visual fields are grossly intact.  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  Strength is grossly symmetrical, I thought she might have been a bit weaker in the right arm than left, 3+ to 4 out of 5 bilaterally.  Lower extremity, she is 2+ to 3 out 5 proximally, 4/5 distally.  Sensation appeared to be grossly intact to pinprick and light touch on the right arm and leg today.  Tone was normal.  Judgment,  orientation, memory, mood were all abnormal as she was in distress and I had a hard time following consistent commands for me today but was pleasant and did make good attempts.  POST ADMISSION PHYSICIAN EVALUATION: 1. Functional deficit secondary to cerebral ischemia with residual     problems with balance and some right hemiparesis.  No definite     stroke was found.  This may be some small subcortical event.  MRI     is pending today.  Blood pressure has been out of control while on     acute.  The patient with left rotator cuff surgery recently as     well. 2. The patient is admitted to receive collaborative interdisciplinary      care between the physiatrist, rehab nursing staff, and therapy     team. 3. The patient's level of medical complexity and substantial therapy     needs in context of that medical necessity cannot be provided at a     lesser intensity of care. 4. The patient has experienced substantial functional loss from her     baseline.  Premorbidly, she was independent.  Currently, she is     requiring min-to-mod assist for bed mobility, transfers, min assist     with gait 26 feet, min-to-mod assist with ADLs.  Judging by the     patient's diagnosis, physical exam, and functional history, she has     a potential for functional progress which will result in measurable     gains while in inpatient rehab.  These gains will be of substantial     and practical use upon discharge to home in facilitating mobility     and self-care. 5. The physiatrist will provide 24-hour management of medical needs as     well as oversight of the therapy plan/treatment and provide     guidance as appropriate regarding interaction of the two.  Medical     problems and plan are below. 6. A 24-hour rehab nursing team will assist in the management of the     patient's skin care needs as well as integration of therapy,     concepts, techniques, education, pain management, etc. 7. PT will assess and treat for lower extremity strength, range of     motion, balance, functional mobility, gait, adaptive techniques,     equipment goals modified independent. 8. OT will assess and treat for upper extremity use, ADLs, adaptive     techniques, equipment, functional mobility, safety, cognitive and     perceptual training with goals modified independent to occasional     min assist. 9. Speech Language Pathology will assess and treat for cognitive and     speech deficits with goals of modified independent. 10.Case management social worker will assess and treat for     psychosocial issues and discharge planning. 11.Team conference will be  held weekly to assess progress towards     goals and to determine barriers at discharge. 12.The patient has demonstrated sufficient medical stability and     exercise capacity to tolerate at least 3 hours of therapy per day     at least 5 days a week 13.Estimated length of stay is 2 weeks.  Prognosis is good.  MEDICAL PROBLEM LIST AND PLAN: 1. Stroke and DVT prophylaxis with Lovenox, Plavix, and aspirin.     Follow up platelets and look for any signs or symptoms of bleeding. 2. Pain management with oxycodone IR.  Also consider further  adjustment of her anticonvulsant medications for headaches if these     are persistent 3. Mood control with Lamictal. 4. History of hepatitis C encephalopathy:  Check ammonia level.  The     patient may be on lactulose in the past and this needs to be     followed up. 5. History of CAD and nonischemic cardiomyopathy:  Plavix and aspirin. 6. Blood pressure control with Coreg.  The patient with suboptimal     control at present, this will need further adjustment going     forward. 7. Asthma:  Spiriva and Combivent. 8. Tobacco abuse and secondary prevention:  The patient needs     education and counseling going forward. 9. Left rotator cuff surgery:  Continue sling and per Orthopedic     instructions.     Ranelle Oyster, M.D.     ZTS/MEDQ  D:  09/15/2010  T:  09/16/2010  Job:  [id number]  cc:   Sherrie Mustache, M.D.  Electronically Signed by Faith Rogue M.D. on 10/15/2010 09:45:41 PM

## 2010-10-28 NOTE — Discharge Summary (Signed)
NAME:  Amber [name], CHOO NO.:  [id number]   MEDICAL RECORD NO.:  [id number]          PATIENT TYPE:  INP   LOCATION:  1435                         FACILITY[facility]  [facility]   PHYSICIAN:  Marcellus Scott, MD     DATE OF BIRTH:  March 10, 1946   DATE OF ADMISSION:  12/27/2008  DATE OF DISCHARGE:  12/30/2008                               DISCHARGE SUMMARY   PRIMARY MEDICAL DOCTOR:  Dr. Sherrie Mustache.   DISCHARGE DIAGNOSES:  1. Encephalopathy or altered mental status.  Improved/possibly      resolved.  2. Hypertension.  3. Tobacco abuse.  4. Hypokalemia.  5. Low back pain.  6. Coronary artery disease.  7. ? Cirrhosis.  8. Cerebrovascular disease.   DISCHARGE MEDICATIONS:  Unchanged from prior to admission except we are  holding Lamictal and citalopram until followup by primary M.D. on  Tuesday of next week.  1. Nexium 40 mg p.o. daily.  2. Lactulose 30 mL p.o. daily.  3. Lotrel 5/20 one p.o. daily.  4. Pravastatin 80 mg p.o. daily.  5. Prednisone 10 mg p.o. daily.  6. Plavix 75 mg p.o. daily.  7. Aspirin 81 mg p.o. daily.  8. Coreg 12.5 mg p.o. b.i.d.  9. Vitamin B 250 mcg p.o. daily.  10.Spiriva inhaler 18 mcg inhaled daily.  11.Imdur 30 mg p.o. daily.   PROCEDURES:  1. Chest x-ray on December 27, 2008, cardiomegaly.  No active lung      disease.  2. CT of the head without contrast.  Impression:      a.     No acute intracranial abnormality.      b.     Mild cerebral atrophy and chronic small vessel disease.  3. A 2D echocardiogram, left ventricle cavity size was normal.      Moderate concentric hypertrophy.  Systolic function was vigorous.      The estimated ejection fraction was 65% to 70%.  Wall motion was      normal.  There were no wall motion abnormalities.  Aortic valve      sclerosis without stenosis.  Left atrium was mildly dilated.   PERTINENT LABS:  Magnesium 1.7.  Basic metabolic panel today with BUN  12, creatinine 0.67, potassium 4.1.  Urine culture, no  growth.  Cardiac  enzymes cycled and negative.  ABG with pH 7.427, pCO2 of 35, pO2 of 65,  bicarbonate 23, oxygen saturation 94% on room air.  Hemoglobin A1c 5.7.  TSH 3.179.  Vitamin B12 of 494.  Lipids unremarkable except for LDL of  101.  BNP 112.  CBC with hemoglobin 13, hematocrit 40.5, white blood  cell 9.4, platelets 211.  Urinalysis with too numerous to count white  blood cells and few bacteria.  Urine drug screen positive for  benzodiazepines and opiates.  Ammonia level normal.  Blood alcohol level  less than 5.  Hepatic panel on December 27, 2008, only remarkable for an  albumin of 2.9, INR 0.9, MCV 108.3.   CONSULTATIONS:  Neurology, Dr. Sharene Skeans.   HOSPITAL COURSE AND PATIENT DISPOSITION:  Amber [name] is a 65 year old  African American  female patient who is said to have alcoholic cirrhosis.  She has abstained from alcohol for the last 13 years.  She also is known  to have coronary artery disease, hypertension, low back pain for which  she has recently had x-ray studies and evaluation at her primary M.D.'s  office.  She was recently admitted to [facility] for  ataxia and found to have urinary tract infection and elevated ammonia.  On the evening of December 27, 2008, she had an event that appeared to be  seizure like.  However, the description for this event is not clear as  to whether it was tonic-clonic or tremor and whether the patient  remained conscious or not.  Five minutes post event the patient was said  to be somnolent and was found similarly by the EMS but hemodynamically  stable.  Patient was brought to the [facility] where workup  was essentially negative.  She was thereby admitted to the hospital for  further evaluation and management.  MRI done at [facility] showed periventricular deep white matter changes in T2 and  flare secondary to microangiopathy but there was no hydrocephalus.  There was no evidence of acute stroke, venous  thrombosis, hydrocephalus,  or subdural hematoma.  She gave a history of a couple of episodes of  seizure-like behavior in the past several months but had not had EEG  evaluation.  1. Change in mental status or encephalopathy.  Probably secondary to      postictal episode versus medications perioperatively.  Patient was      admitted to telemetry with no arrhythmia alarms.  She did not      demonstrate any further seizure-like episodes.  Her metabolic      workup was negative.  Patient had no features of sepsis.  Her      mental status has progressively improved.  In talking to her      daughter, Amber [name], patient is almost 70% or more better today with      occasional episodes of some mild confusion.  She is awake, alert,      oriented x4 with no focal neurological deficits at this time.      Neurology was consulted.  Dr. Sharene Skeans saw her and then followed      her again yesterday and reviewed the EEG.  He indicates that he      would not start the patient on antiepileptic medications as there      is no compelling reason to do so.  2. Hypertension which is fairly controlled on the current medications.  3. Tobacco abuse.  Cessation counseling done.  4. Hypokalemia which is repleted and corrected.  5. History of hepatitis/cirrhosis.  Unclear if this is a true      diagnosis given that she has normal LFTs and INR.  6. Low back pain.  Patient states she has had a recent x-ray done at      her primary M.D.'s office and did not wish for it to be repeated.      She indicates she has been told not to take Tylenol but claims she      takes Percocet and Tylenol No. 3 at home.  She was provided with      tramadol on an as-needed basis.  She will not be discharged on any      pain medications at this time.   Physical Therapy and Occupational Therapy have seen her and recommended  home health physical therapy and occupational therapy.  [name]  indicates that patient lives with her sister.  Her  sister is around but  when the sister goes to work [name] is with her mother and hence  patient has 24/7 supervision at home.  At this time, we will  clarify what her home health requirements are prior to discharge home.  Patient already has an appointment to follow up with her primary M.D. on  Tuesday, January 01, 2009.  She has been encouraged to keep up that  appointment.   TIME TAKEN IN COORDINATING THIS DISCHARGE:  35 minutes.      Marcellus Scott, MD  Electronically Signed     AH/MEDQ  D:  12/30/2008  T:  12/30/2008  Job:  [id number]   cc:   Sherrie Mustache, M.D.  Fax: [phone]   Deanna Artis. Sharene Skeans, M.D.  Fax: [phone]

## 2010-10-28 NOTE — Consult Note (Signed)
NAME:  Amber [name], Amber [name] NO.:  [id number]   MEDICAL RECORD NO.:  [id number]          PATIENT TYPE:  INP   LOCATION:  1435                         FACILITY:  [facility]   PHYSICIAN:  Deanna Artis. Hickling, M.D.DATE OF BIRTH:  June 18, 1945   DATE OF CONSULTATION:  12/28/2008  DATE OF DISCHARGE:                                 CONSULTATION   CHIEF COMPLAINT:  Evaluate for seizures and altered mental status.   HISTORY OF PRESENT CONDITION:  The patient is a 65 year old woman who  has a longstanding history of alcohol use with cirrhosis.  She last used  alcohol about 13 years ago.  She has a history of coronary artery  disease.  The patient was recently admitted to [facility] because of ataxia and was noted to have an urinary tract  infection, and found evidence of an elevated ammonia level that was  treated with lactulose.   The patient was noted at 10:30 on the evening of December 27, 2008 to have  an event that appeared to be a seizure.  The witnesses were vague in  terms of their description.  It was not clear whether this was tonic-  clonic activity, tremor or whether the patient remained conscious during  the episode.  In the aftermath of a 5-minute episode, the patient was  somnolent and found by EMS to be somnolent and hemodynamically stable.   The patient was brought to [facility] where she was assessed  in the emergency room and found to have a CT scan of the brain that  showed mild diffuse atrophy, but no other acute changes.  CBC with  differential showed megaloblastic indices with an MCV of 108.3,  hemoglobin 12.4, white count 5300, platelet count 185,000, normal  differential.  Prothrombin time was normal at 12.7 with an INR 0.9.  Comprehensive metabolic panel was normal including basic metabolic  panel, renal functions, transaminases.  The only thing that was low with  albumin which was 2.9.  Calcium was 9.1 which in this setting might  actually be mildly elevated.  Alcohol level less than 5.  Serum ammonia  17.  Urine drug screen positive for benzodiazepines, opiates (both of  which she takes at home).  Urinalysis was normal other than an elevated  leukocyte esterase with negative nitrite.  The patient did not have  significant pyuria and I suspect that this is a specious finding.  Cardiac panel was negative.  BNP was 112 showing mild elevation.  Lipid  profile:  Cholesterol 163, HDL 65, LDL 101, triglyceride 87, VLDL 17,  vitamin B12 494, TSH 3.179, hemoglobin A1c 5.7, blood gas 7.43, pCO2  35.2, PO2 65.1, bicarbonate 2.8.  So there does not appear to be any  metabolic etiology for the patient's obtundation.   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  There was no hydrocephalus.  Ventriculomegaly was  proportional to the  degree of cortical atrophy.  There was no evidence  of an acute stroke, venous thrombus hydrocephalus or subdural hematoma.   The patient has had a couple of episodes of seizure-like behavior in the  past several months, one for certain a month ago.  This patient has not  been evaluated with an EEG until today.   PAST MEDICAL HISTORY:  1. Positive for coronary artery disease.  2. Hypertension.  3. Cerebrovascular disease.  4. Cirrhosis of the liver.  5. Hepatitis.  6. Coronary artery disease.  7. Myocardial infarction x2.  8. Recent acute renal failure which has resolved.   PAST SURGICAL HISTORY:  The patient had an iridectomy 3 days ago.  She  has been sleepy since that time.   SOCIAL HISTORY:  The patient smokes 1-1/2 packs of cigarettes per day.  The patient quit drinking 13 years ago.   CURRENT MEDICATIONS:  1. Xanax.  2. Amlodipine.  3. Cyclobenzaprine.  4. Lisinopril.  5. Plavix.  6.  Pravastatin.  7. Coreg.  8. It appears also that she received Dilaudid.  I do not know if that      was in the hospital or also as an outpatient.   DRUG ALLERGIES/INTOLERANCES:  1. DARVOCET.  2. MORPHINE.  3. PENICILLIN.  Family members at the bedside did not know what the      symptoms were that the patient had.  She also was not able to tell      me.   REVIEW OF SYSTEMS:  Total system review is unremarkable for any  recurrent infections of the head, neck, lungs, GI, GU, rash, anemia,  diabetes or thyroid disease.  System review is otherwise negative.   PHYSICAL EXAMINATION:  GENERAL:  On examination today, the patient is  obtunded.  I have to call to her repeatedly to get her to perform.  VITAL SIGNS:  Temperature 98.0, blood pressure 160/97, resting pulse 84,  respirations 18, oxygen saturation 99%, weight 72.7 kilos, height 65  inches.  HEAD, EYES, EARS, NOSE, THROAT:  No infections.  No bruits.  LUNGS:  Clear.  HEART:  No murmurs.  Pulses are normal.  ABDOMEN:  Soft.  Bowel sounds are normal.  No hepatosplenomegaly.  EXTREMITIES:  Unremarkable with slight edema.  NEUROLOGIC:  The patient is obtunded.  When aroused, she is appropriate.  No dysphasia is present.  CRANIAL NERVES:  Round and reactive right pupil status post iridectomy  and lens implant.  Fundus was normal.  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  Visual fields are full to double simultaneous stimuli.  She  has symmetric facial strength at least in the lower face.  The left  eyelid is a little bit more droopy than the right.  I suspect that is  related to her previous iridectomy and enucleation.  MOTOR:  The patient had decreased effort, but she had nearly normal  strength in all 4  extremities.  She wiggles her fingers.  She opposes  her fingers well.  SENSATION:  Peripheral polyneuropathy in a stocking distribution.  She  had good stereognosis.  CEREBELLAR:  She had some trouble with finger-to-nose showing  some  dysmetria.  Gait was not tested.  She is hypo- or areflexic.  She had  bilateral flexor plantar responses.   IMPRESSION:  Delirium, 293.0, etiology is unknown.  This could be  related to medications.  She could be postictal.  She has been sleepy  since her operation.  There are no focal neurologic  deficits.  Therefore, I think that stroke is highly unlikely.   I have reviewed the CT scan of the brain, the MRI of the brain and have  commented on them.  The patient may very well have had a generalized  tonic-clonic seizure and may have had one a month ago.  I did not see a  bitten tongue.  EEG is pending at this time and will be reviewed at  [facility].   If we were to place her on antiepileptic medicine, I would try use a  medicine that did not require metabolism by the liver.  This would bring  Keppra to the front that or generic levetiracetam.  I discussed this  with her daughter and granddaughter who are at bedside.  I see no reason  to repeat the MRI scan.   I appreciate the opportunity to participate in her care.  If you have  questions or I can be of assistance, do not hesitate to contact me.      Deanna Artis. Sharene Skeans, M.D.  Electronically Signed     WHH/MEDQ  D:  12/28/2008  T:  12/28/2008  Job:  [id number]

## 2010-10-28 NOTE — Procedures (Signed)
EEG NUMBER:  [id number].   CLINICAL HISTORY:  The patient is a 65 year old woman admitted with a  possible generalized seizure.  This is associated with generalized  jerking, unresponsive staring, and postictal somnolence.  The patient  has a history of alcohol use, but none in 13 years.  She has coronary  artery disease, hypertension, cirrhosis, myocardial infarction, renal  failure, prior cerebrovascular accident.  She has poor appetite.  She  had cataract surgery 3 days ago.  She has remained sleepy since that  time.  She has tobacco abuse.  (780.39)   PROCEDURE:  The tracing is carried out on a 32-channel digital Cadwell  recorder reformatted into 16-channel montages with 1 devoted to EKG.  The patient was awake, drowsy and asleep during the recording.  The  international 10/20 system lead placement was used.   MEDICATIONS:  Norvasc, Coreg, Haldol, heparin, Ativan, Protonix, Xanax,  Dilaudid, and Zofran.   DESCRIPTION OF FINDINGS:  The record begins with a 5-7 Hz 25 microvolt  theta range activity.  There is an 8 Hz central 20 microvolt activity.  The patient drifts into natural sleep with vertex sharp waves and  symmetric and synchronous sleep spindles.  The last couple of minutes  the patient was aroused with a 9 Hz 45 microvolt alpha range activity.   There was no focal slowing.  There was no interictal epileptiform  activity in the form of spikes or sharp waves.  Activating procedures  were not carried out.   IMPRESSION:  Essentially normal record awake and asleep.       Deanna Artis. Sharene Skeans, M.D.  Electronically Signed     EAV:[id number]  D:  12/28/2008 23:13:09  T:  12/29/2008 04:30:04  Job #:  [id number]   cc:   Richarda Overlie, MD

## 2010-10-28 NOTE — H&P (Signed)
NAME:  Amber [name], Amber [name] NO.:  [id number]   MEDICAL RECORD NO.:  [id number]          PATIENT TYPE:  EMS   LOCATION:  ED                           FACILITY:  [facility]   PHYSICIAN:  Richarda Overlie, MD       DATE OF BIRTH:  [birth year]   DATE OF ADMISSION:  12/27/2008  DATE OF DISCHARGE:                              HISTORY & PHYSICAL   PRIMARY CARE PHYSICIAN:  Dr. Sherrie Mustache, M.D.   OBJECTIVE:  This is a 65 year old female with a history of alcohol use  and history of coronary artery disease, presents to the ER with a chief  complaint of seizures.  The patient does not provide any history and is  somnolent.  The daughter, who accompanies the patient, states at around  10:30 this evening, the patient was found to be obtunded, and her sister  thought that she might have had what appeared to be a seizure.  When  asked to describe whether she had any tonic/clonic activity or whether  the patient was conscious during the episode, the daughter really cannot  provide any specifics of the episode, but apparently lasted for about 5  minutes.  They called EMS, and the patient was at that time found to be  somnolent, hemodynamically stable.  The daughter states that they  checked her sugar, and the sugar was normal.  The patient was recently  admitted at [facility] and treated for urinary tract  infection.  Also treated for an elevated ammonia level .  The patient  has not been drinking, at least for the last 13 years and has been  sober.  She complained of a headache earlier during the day.  The  patient was found to be confused with an altered state of mind at least  about 45 minutes after she arrived to the ER.  Currently, the patient is  more awake but still does not provide me with any history.  The  patient's daughter states that she also had a cataract removal on  Tuesday.  The family has not noticed any dysarthria.  No unilateral  weakness.   The daughter  states that the patient may have had a couple of  episodes  of what looked like a seizure over the last several months, but it has  not been evaluated thus far.   PAST MEDICAL HISTORY:  Coronary artery disease, hypertension,  cerebrovascular disease, cirrhosis of liver, hepatitis, history of  coronary artery disease, and myocardial infarction x2, recent episode of  renal failure.   SURGICAL HISTORY:  None except for recent cataract surgery on Tuesday.   SOCIAL HISTORY:  This patient is a current smoker and smokes 1-1/2 packs  a day.  The patient was a former drinker but quit 13 years ago.   ALLERGIES:  DARVOCET, MORPHINE, and PENICILLIN.   HOME MEDICATIONS:  Xanax, Amlodipine, cyclobenzaprine, lisinopril,  Plavix, pravastatin, Coreg but the dosages are unknown.   REVIEW OF SYSTEMS:  As documented in the HPI.   PHYSICAL EXAMINATION:  VITAL SIGNS: Blood pressure 133.76, pulse of 78,  respirations 18, temperature  97.9.  GENERAL:  The patient appears to be somnolent currently in no acute  distress.  HEENT: Pupils are equal and reactive.  The patient's left eye appears to  be red and injected.  Extraocular movements are intact.  Sclerae are  anicteric.  NECK:  Supple without any JVD.  No neck stiffness or meningismus.  LUNGS:  Clear to auscultation bilaterally.  No wheezes or crackles.  CARDIOVASCULAR:  Regular rate and rhythm.  No appreciable murmurs or  gallops.  ABDOMEN:  Soft, nontender, nondistended without any cyanosis, clubbing  or edema.  NEUROLOGIC:  Cranial nerves II-XII grossly intact.  No gross cerebellar  deficits noted.  EXTREMITIES:  Motor strength and sensory intact in bilateral upper and  lower extremities.   LABS:  Chest x-ray shows cardiomegaly, no active lung disease.   CT of the head shows no evidence of any intracranial hemorrhage.  Mild  cerebral atrophy and small-vessel disease.   Urinalysis shows large amount of leukocyte esterase.  Urine drug screen   positive for benzodiazepines and opiates.  Ammonia level of 17, alcohol  level less than 5.  INR of 0.9.  Sodium of 138, potassium 3.8, chloride  104, bicarb 27, glucose 95, BUN 10, creatinine 0.79, T bili 0.6,  alkaline phosphatase 87, AST 32, ALT 19, calcium 9.1.  CBC shows WBC of  4.3, hemoglobin 12.4, hematocrit 37.4, MCV 108.  Platelet count of 185.   ASSESSMENT:  1. Possible seizure.  2. Macrocytic anemia.  3. Cirrhosis.  4. Hypertension.  5. History of stroke.   PLAN:  1. Patient will be admitted to the telemetry unit.  Will be monitored      with serial neuro checks.  Seizure precautions will be used.  I am      not quite sure if the patient's episode of unresponsiveness was a      seizure or not, as I did not get any history of tongue-biting or      urinary or stool incontinence.  The patient definitely was agitated      and confused at the time she presented, but currently is much more      alert.  Her confusion could be secondary to her opiates and      benzodiazepines that she is on.  We will check an ABG to rule out      CO2 retention.  Will also order an MRI/MRA of her brain to rule out      any underlying cause of her seizure such as mass lesions, in the      morning.  2. Coronary artery disease, hypertension, history of smoking and      underlying dysrhythmias will be ruled out; therefore, she will be      monitored on telemetry.  Will also obtain a 2D echo in the morning      because of presence of cardiomegaly on the chest x-ray.  Will      continue her on Plavix and Coreg.  3. Confusion:  Will continue the patient on p.r.n. Haldol 2 mg IV      q.6h. and p.r.n. Xanax as needed for anxiety.  Low-dose Xanax will      need to be continued to prevent the patient from having      benzodiazepine-withdrawal seizures.  She is a full code.      Richarda Overlie, MD  Electronically Signed     NA/MEDQ  D:  12/28/2008  T:  12/28/2008  Job:  [id number]

## 2010-10-31 NOTE — Discharge Summary (Signed)
[place]  Patient:    Amber [name], Amber [name]                     MRN: [id number] Adm. Date:  [id number] Disc. Date: [id number] Attending:  Sandi Raveling Dictator:   Bernerd Pho, M.D. CC:         Madaline Guthrie, M.D., [facility]   Discharge Summary  DISCHARGE DIAGNOSIS:  Mild hepatic encephalopathy, now resolved.  DISCHARGE MEDICATIONS:  The patient is instructed to continue with all of her prior medications at the previous doses with the addition of lactulose 30 mg p.o. t.i.d. until her follow-up appointment with Dr. Wyonia Hough.  DISCHARGE FOLLOWUP:  The patient already has an appointment to see Dr. Wyonia Hough on Monday, August 30, 2000, at 2:45 p.m.  At this visit the patient should be clinically assessed in regards to hepatic encephalopathy, and her ammonia can be checked if warranted.  CONSULTATIONS:  None.  PROCEDURES:  None.  CHIEF COMPLAINT:  Slightly slurred speech.  HISTORY OF PRESENT ILLNESS:  The patient is a 65 year old African-American female with history of alcoholic cirrhosis and hepatitis, bipolar disorder, and history of hepatic encephalopathy who presents with a four-day history of worsening speech and increased tremor.  The patient also acknowledges the development of unsteady gait over the last several days.  The patient was on lactulose, but stopped roughly six months prior to admission and has not had any difficulties in regards to hepatic encephalopathy until recently.  The patient was seen by her primary care Burleigh Brockmann the day prior to admission and was given lactulose for presumed recurrence of encephalopathy.  Ammonia during that visit was 28, and the patients CMET was reported as within normal limits.  Because of worsening symptoms despite being started on lactulose, the patient presents to the emergency department, and the patients family requests admission.  The patient denies any  peripheral sensory changes, weakness, fever, or any syncope or presyncope.  The patient denies any diarrhea and states that she has been constipated without a bowel movement for the last several days.  The patient also acknowledges mild abdominal discomfort.  PAST MEDICAL HISTORY: 1. 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 2. History of hepatic encephalopathy. 3. Hypertension diagnosed roughly six years ago. 4. Alcoholism. 5. History of GI bleed with hospitalization at [facility]    in 1998. 6. CHF diagnosed in 1998. 7. Asthma.  MEDICATIONS ON ADMISSION: 1. Lithium 600 mg p.o. q.h.s. 2. Hydrochlorothiazide 25 mg p.o. q.d. 3. Vasotec. 4. Zoloft 50 mg p.o. q.a.m. 5. Albuterol metered-dose inhaler. 6. Propoxyphene.  ALLERGIES:  PENICILLIN.  SOCIAL HISTORY:  The patient lives alone and performs all of her activities of daily living.  The patient states she has not consumed alcohol in the last three years.  The patient denies any drug use and states she smokes about two to three cigarettes a day.  FAMILY HISTORY:  Noncontributory.  PHYSICAL EXAMINATION ON ADMISSION:  GENERAL APPEARANCE:  In general, the patient has mild slurred speech, but is in no apparent distress.  VITAL SIGNS:  Temperature 97.7, pulse 62, blood pressure 177/112, respiratory rate 20, and oxygen saturation on room air 99%.  HEENT:  Pupils reactive.  Sclerae, conjunctivae, cornea clear without any marked icterus.  Oropharynx without any lesions.  NECK:  Supple.  No JVD or bruits.  PULMONARY:  Clear to auscultation bilaterally.  CARDIOVASCULAR:  Regular rate and rhythm.  No murmurs, clicks, gallops,  or rubs.  EXTREMITIES:  No clubbing, cyanosis, or edema.  NEUROLOGIC:  The patient is alert and oriented x 4.  Cranial nerves are intact, and the patient has 3/5 motor strength throughout with a question of poor cooperation.  The patient does have slight asterixis.  Plantar  reflexes downgoing, and deep tendon reflexes 2+ throughout.  ADMISSION LABORATORY AND X-RAY DATA:  White count 9.9, hemoglobin 12.1, platelets 270.  Sodium 140, potassium 3.9, chloride 110, bicarb 26, BUN 9, creatinine 0.7, and glucose 91.  AST 24, ALT 16, alk phos 67, total bili 0.7, albumin 3.1.  Lithium level 0.69.  Ammonia 44.  Admission chest x-ray:  No active disease with cardiomegaly.  HOSPITAL COURSE:  MILD HEPATIC ENCEPHALOPATHY:  The patients worsening symptoms over the days prior to admission were attributed to inadequate bowel movements despite being prescribed lactulose.  The patient had an enema on the night of admission and was restarted on lactulose 30 cc q.i.d.  The patients enema and lactulose yielded a fairly large bowel movement after which the patient felt much better and stated that she was less confused.  By March 14, the patient felt as if she was nearing her baseline and was able to ambulate in the halls as before.  In order to assess the patients liver function, the patients coags were checked and were normal with a PT of 11.7 and a PTT of 24.  The patient was without any infectious symptomatology during this admission and was without any abdominal pain by March 14.  The patients admission symptomatology was attributed to inadequate bowel movements with possible increase in protein consumption.  DISPOSITION:  The patient is being discharged to home.  DISCHARGE LABORATORY DATA:  White blood cell count 10.6 with an ANC of 5, hemoglobin 12.1, platelets 270.  Sodium 140, potassium 3.8, chloride 111, bicarb 24, BUN 10, creatinine 0.7, glucose 85.  AST 25, ALT 28, alk phos 74, total bili 0.7.  DISCHARGE INSTRUCTIONS:  Diet:  Low-protein diet.  RESIDENT PHYSICIANS:  Karlene Einstein, M.D. and Bernerd Pho, M.D. DD:  09/08/00 TD:  09/09/00 Job: [id number]

## 2010-11-13 ENCOUNTER — Observation Stay: Payer: Self-pay | Admitting: Internal Medicine

## 2011-01-12 ENCOUNTER — Emergency Department: Payer: Self-pay | Admitting: Unknown Physician Specialty

## 2011-04-28 ENCOUNTER — Inpatient Hospital Stay: Payer: Self-pay | Admitting: Internal Medicine

## 2011-06-07 ENCOUNTER — Emergency Department: Payer: Self-pay | Admitting: Emergency Medicine

## 2011-06-11 ENCOUNTER — Emergency Department: Payer: Self-pay | Admitting: Emergency Medicine

## 2011-06-16 ENCOUNTER — Ambulatory Visit: Payer: Self-pay | Admitting: Internal Medicine

## 2011-06-19 ENCOUNTER — Inpatient Hospital Stay: Payer: Self-pay | Admitting: Specialist

## 2011-06-19 LAB — URINALYSIS, COMPLETE
Leukocyte Esterase: NEGATIVE
Nitrite: NEGATIVE
Ph: 5 (ref 4.5–8.0)
Protein: NEGATIVE
RBC,UR: <1 /HPF (ref 0–5)
WBC UR: 1 /HPF (ref 0–5)

## 2011-06-19 LAB — BASIC METABOLIC PANEL
BUN: 59 mg/dL — ABNORMAL HIGH (ref 7–18)
Chloride: 111 mmol/L — ABNORMAL HIGH (ref 98–107)
Creatinine: 2.66 mg/dL — ABNORMAL HIGH (ref 0.60–1.30)
EGFR (African American): 23 — ABNORMAL LOW
EGFR (Non-African Amer.): 19 — ABNORMAL LOW
Glucose: 88 mg/dL (ref 65–99)
Potassium: 5.7 mmol/L — ABNORMAL HIGH (ref 3.5–5.1)
Sodium: 144 mmol/L (ref 136–145)

## 2011-06-19 LAB — CBC
HCT: 42.3 % (ref 35.0–47.0)
HGB: 13.5 g/dL (ref 12.0–16.0)
MCHC: 31.8 g/dL — ABNORMAL LOW (ref 32.0–36.0)
MCV: 98 fL (ref 80–100)
RDW: 14.6 % — ABNORMAL HIGH (ref 11.5–14.5)

## 2011-06-19 LAB — HEPATIC FUNCTION PANEL A (ARMC)
Alkaline Phosphatase: 59 U/L (ref 50–136)
SGPT (ALT): 17 U/L

## 2011-06-19 LAB — POTASSIUM: Potassium: 5.8 mmol/L — ABNORMAL HIGH (ref 3.5–5.1)

## 2011-06-20 LAB — CBC WITH DIFFERENTIAL/PLATELET
Basophil %: 0.5 %
Eosinophil #: 0.1 10*3/uL (ref 0.0–0.7)
Eosinophil %: 1.2 %
HCT: 37.5 % (ref 35.0–47.0)
HGB: 12 g/dL (ref 12.0–16.0)
Lymphocyte %: 20.5 %
MCH: 31.2 pg (ref 26.0–34.0)
MCHC: 32 g/dL (ref 32.0–36.0)
MCV: 98 fL (ref 80–100)
Monocyte #: 0.8 10*3/uL — ABNORMAL HIGH (ref 0.0–0.7)
Neutrophil #: 9.3 10*3/uL — ABNORMAL HIGH (ref 1.4–6.5)
Neutrophil %: 71.6 %

## 2011-06-20 LAB — CK TOTAL AND CKMB (NOT AT ARMC)
CK, Total: 99 U/L (ref 21–215)
CK-MB: 1.6 ng/mL (ref 0.5–3.6)

## 2011-06-20 LAB — BASIC METABOLIC PANEL
BUN: 26 mg/dL — ABNORMAL HIGH (ref 7–18)
Calcium, Total: 8.7 mg/dL (ref 8.5–10.1)
Creatinine: 1.17 mg/dL (ref 0.60–1.30)
EGFR (African American): 60 — ABNORMAL LOW
EGFR (Non-African Amer.): 49 — ABNORMAL LOW
Glucose: 94 mg/dL (ref 65–99)
Potassium: 5.2 mmol/L — ABNORMAL HIGH (ref 3.5–5.1)
Sodium: 145 mmol/L (ref 136–145)

## 2011-06-20 LAB — TROPONIN I: Troponin-I: 0.03 ng/mL

## 2011-06-21 LAB — BASIC METABOLIC PANEL
Anion Gap: 11 (ref 7–16)
Calcium, Total: 9 mg/dL (ref 8.5–10.1)
Chloride: 113 mmol/L — ABNORMAL HIGH (ref 98–107)
Co2: 20 mmol/L — ABNORMAL LOW (ref 21–32)
Osmolality: 287 (ref 275–301)
Potassium: 4.6 mmol/L (ref 3.5–5.1)

## 2011-06-23 LAB — URINALYSIS, COMPLETE
Glucose,UR: NEGATIVE mg/dL (ref 0–75)
Ketone: NEGATIVE
RBC,UR: 49 /HPF (ref 0–5)
Squamous Epithelial: 5
WBC UR: 2150 /HPF (ref 0–5)

## 2011-06-25 LAB — URINE CULTURE

## 2011-06-30 ENCOUNTER — Emergency Department: Payer: Self-pay | Admitting: Emergency Medicine

## 2011-07-01 LAB — URINALYSIS, COMPLETE
Blood: NEGATIVE
Ketone: NEGATIVE
Nitrite: NEGATIVE
Protein: NEGATIVE
Specific Gravity: 1.01 (ref 1.003–1.030)
Squamous Epithelial: NONE SEEN
WBC UR: 1 /HPF (ref 0–5)

## 2011-07-01 LAB — COMPREHENSIVE METABOLIC PANEL
Albumin: 3.6 g/dL (ref 3.4–5.0)
Alkaline Phosphatase: 75 U/L (ref 50–136)
Anion Gap: 15 (ref 7–16)
Calcium, Total: 9.4 mg/dL (ref 8.5–10.1)
Co2: 21 mmol/L (ref 21–32)
Creatinine: 1.35 mg/dL — ABNORMAL HIGH (ref 0.60–1.30)
EGFR (African American): 51 — ABNORMAL LOW
EGFR (Non-African Amer.): 42 — ABNORMAL LOW
Glucose: 90 mg/dL (ref 65–99)
Osmolality: 290 (ref 275–301)
SGOT(AST): 25 U/L (ref 15–37)
Sodium: 144 mmol/L (ref 136–145)

## 2011-07-01 LAB — CBC WITH DIFFERENTIAL/PLATELET
Eosinophil %: 1.5 %
Lymphocyte #: 1.8 10*3/uL (ref 1.0–3.6)
Lymphocyte %: 31 %
MCH: 31 pg (ref 26.0–34.0)
MCHC: 32.1 g/dL (ref 32.0–36.0)
MCV: 96 fL (ref 80–100)
Neutrophil %: 56.9 %
Platelet: 196 10*3/uL (ref 150–440)
RBC: 3.83 10*6/uL (ref 3.80–5.20)
RDW: 15.6 % — ABNORMAL HIGH (ref 11.5–14.5)

## 2011-07-06 ENCOUNTER — Inpatient Hospital Stay: Payer: Self-pay | Admitting: Student

## 2011-07-06 LAB — BASIC METABOLIC PANEL
Anion Gap: 11 (ref 7–16)
Calcium, Total: 9.2 mg/dL (ref 8.5–10.1)
Co2: 22 mmol/L (ref 21–32)
Creatinine: 1.96 mg/dL — ABNORMAL HIGH (ref 0.60–1.30)
EGFR (African American): 33 — ABNORMAL LOW
EGFR (Non-African Amer.): 27 — ABNORMAL LOW
Osmolality: 288 (ref 275–301)

## 2011-07-06 LAB — URINALYSIS, COMPLETE
Glucose,UR: NEGATIVE mg/dL (ref 0–75)
Hyaline Cast: 1
Ketone: NEGATIVE
Leukocyte Esterase: NEGATIVE
Nitrite: NEGATIVE
Ph: 5 (ref 4.5–8.0)
Protein: NEGATIVE
Specific Gravity: 1.019 (ref 1.003–1.030)
WBC UR: 1 /HPF (ref 0–5)

## 2011-07-06 LAB — CBC
HCT: 38.4 % (ref 35.0–47.0)
RBC: 3.95 10*6/uL (ref 3.80–5.20)
RDW: 15.7 % — ABNORMAL HIGH (ref 11.5–14.5)
WBC: 6.8 10*3/uL (ref 3.6–11.0)

## 2011-07-06 LAB — TROPONIN I: Troponin-I: <0.02 ng/mL

## 2011-07-07 LAB — BASIC METABOLIC PANEL
Anion Gap: 12 (ref 7–16)
BUN: 26 mg/dL — ABNORMAL HIGH (ref 7–18)
Chloride: 111 mmol/L — ABNORMAL HIGH (ref 98–107)
Creatinine: 1.68 mg/dL — ABNORMAL HIGH (ref 0.60–1.30)
Osmolality: 291 (ref 275–301)
Potassium: 4.7 mmol/L (ref 3.5–5.1)
Sodium: 144 mmol/L (ref 136–145)

## 2011-07-07 LAB — MAGNESIUM: Magnesium: 1.8 mg/dL

## 2011-07-07 LAB — CBC WITH DIFFERENTIAL/PLATELET
Basophil #: 0.1 10*3/uL (ref 0.0–0.1)
Eosinophil %: 1.4 %
HCT: 33.6 % — ABNORMAL LOW (ref 35.0–47.0)
HGB: 10.8 g/dL — ABNORMAL LOW (ref 12.0–16.0)
Lymphocyte #: 2 10*3/uL (ref 1.0–3.6)
Lymphocyte %: 34.9 %
MCHC: 32.2 g/dL (ref 32.0–36.0)
Monocyte %: 8.1 %
Neutrophil #: 3.1 10*3/uL (ref 1.4–6.5)
Platelet: 167 10*3/uL (ref 150–440)
RBC: 3.46 10*6/uL — ABNORMAL LOW (ref 3.80–5.20)
RDW: 15.3 % — ABNORMAL HIGH (ref 11.5–14.5)
WBC: 5.7 10*3/uL (ref 3.6–11.0)

## 2011-07-07 LAB — AMMONIA: Ammonia, Plasma: <25 mcmol/L (ref 11–32)

## 2011-07-09 LAB — BASIC METABOLIC PANEL
Anion Gap: 10 (ref 7–16)
Calcium, Total: 8.7 mg/dL (ref 8.5–10.1)
Chloride: 109 mmol/L — ABNORMAL HIGH (ref 98–107)
Co2: 22 mmol/L (ref 21–32)
EGFR (Non-African Amer.): >60
Osmolality: 279 (ref 275–301)
Potassium: 5.6 mmol/L — ABNORMAL HIGH (ref 3.5–5.1)

## 2011-07-10 LAB — BASIC METABOLIC PANEL
Calcium, Total: 9 mg/dL (ref 8.5–10.1)
Co2: 19 mmol/L — ABNORMAL LOW (ref 21–32)
EGFR (African American): 59 — ABNORMAL LOW
EGFR (Non-African Amer.): 48 — ABNORMAL LOW
Glucose: 94 mg/dL (ref 65–99)
Osmolality: 288 (ref 275–301)
Potassium: 5 mmol/L (ref 3.5–5.1)

## 2011-07-17 ENCOUNTER — Ambulatory Visit: Payer: Self-pay | Admitting: Internal Medicine

## 2011-07-17 LAB — COMPREHENSIVE METABOLIC PANEL
Albumin: 3.6 g/dL (ref 3.4–5.0)
Anion Gap: 11 (ref 7–16)
Calcium, Total: 9.4 mg/dL (ref 8.5–10.1)
Chloride: 106 mmol/L (ref 98–107)
Co2: 24 mmol/L (ref 21–32)
EGFR (African American): 45 — ABNORMAL LOW
EGFR (Non-African Amer.): 37 — ABNORMAL LOW
Glucose: 83 mg/dL (ref 65–99)
Potassium: 4.4 mmol/L (ref 3.5–5.1)
SGOT(AST): 30 U/L (ref 15–37)
SGPT (ALT): 22 U/L
Sodium: 141 mmol/L (ref 136–145)

## 2011-07-17 LAB — CBC
MCH: 31.1 pg (ref 26.0–34.0)
MCV: 96 fL (ref 80–100)
Platelet: 192 10*3/uL (ref 150–440)
RBC: 3.77 10*6/uL — ABNORMAL LOW (ref 3.80–5.20)
WBC: 7.4 10*3/uL (ref 3.6–11.0)

## 2011-07-17 LAB — SALICYLATE LEVEL: Salicylates, Serum: <1.7 mg/dL

## 2011-07-17 LAB — ACETAMINOPHEN LEVEL: Acetaminophen: <2 ug/mL

## 2011-07-17 LAB — ETHANOL: Ethanol %: <0.003 % (ref 0.000–0.080)

## 2011-07-18 LAB — URINALYSIS, COMPLETE
Bilirubin,UR: NEGATIVE
Blood: NEGATIVE
Nitrite: NEGATIVE
Ph: 6 (ref 4.5–8.0)
Protein: NEGATIVE
RBC,UR: 1 /HPF (ref 0–5)
Specific Gravity: 1.015 (ref 1.003–1.030)
Squamous Epithelial: <1

## 2011-07-18 LAB — DRUG SCREEN, URINE
Amphetamines, Ur Screen: NEGATIVE (ref ?–1000)
Barbiturates, Ur Screen: NEGATIVE (ref ?–200)
Methadone, Ur Screen: NEGATIVE (ref ?–300)

## 2011-07-19 ENCOUNTER — Inpatient Hospital Stay: Payer: Self-pay | Admitting: Internal Medicine

## 2011-07-19 LAB — BASIC METABOLIC PANEL
Anion Gap: 9 (ref 7–16)
BUN: 23 mg/dL — ABNORMAL HIGH (ref 7–18)
Calcium, Total: 9 mg/dL (ref 8.5–10.1)
Chloride: 111 mmol/L — ABNORMAL HIGH (ref 98–107)
Co2: 21 mmol/L (ref 21–32)
EGFR (African American): 53 — ABNORMAL LOW
Potassium: 4.1 mmol/L (ref 3.5–5.1)
Sodium: 141 mmol/L (ref 136–145)

## 2011-07-20 LAB — MAGNESIUM: Magnesium: 1.7 mg/dL — ABNORMAL LOW

## 2011-07-20 LAB — PROTIME-INR: INR: 0.9

## 2011-07-20 LAB — CBC WITH DIFFERENTIAL/PLATELET
Basophil %: 0.5 %
Eosinophil %: 1.7 %
HCT: 35.5 % (ref 35.0–47.0)
HGB: 11.3 g/dL — ABNORMAL LOW (ref 12.0–16.0)
Lymphocyte #: 1.7 10*3/uL (ref 1.0–3.6)
Lymphocyte %: 28.8 %
MCH: 30.7 pg (ref 26.0–34.0)
MCV: 96 fL (ref 80–100)
Monocyte #: 0.4 10*3/uL (ref 0.0–0.7)
Monocyte %: 6.9 %
Neutrophil %: 62.1 %
RBC: 3.68 10*6/uL — ABNORMAL LOW (ref 3.80–5.20)
WBC: 6 10*3/uL (ref 3.6–11.0)

## 2011-07-20 LAB — COMPREHENSIVE METABOLIC PANEL
Albumin: 3.5 g/dL (ref 3.4–5.0)
Alkaline Phosphatase: 60 U/L (ref 50–136)
Anion Gap: 8 (ref 7–16)
Calcium, Total: 9.5 mg/dL (ref 8.5–10.1)
Chloride: 109 mmol/L — ABNORMAL HIGH (ref 98–107)
Co2: 24 mmol/L (ref 21–32)
EGFR (African American): 53 — ABNORMAL LOW
Osmolality: 283 (ref 275–301)
Potassium: 4 mmol/L (ref 3.5–5.1)
SGOT(AST): 27 U/L (ref 15–37)
Sodium: 141 mmol/L (ref 136–145)

## 2011-07-20 LAB — AMMONIA: Ammonia, Plasma: 30 mcmol/L (ref 11–32)

## 2011-07-20 LAB — CK TOTAL AND CKMB (NOT AT ARMC)
CK, Total: 255 U/L — ABNORMAL HIGH (ref 21–215)
CK, Total: 217 U/L — ABNORMAL HIGH (ref 21–215)
CK-MB: 1.1 ng/mL (ref 0.5–3.6)

## 2011-07-20 LAB — TROPONIN I
Troponin-I: <0.02 ng/mL
Troponin-I: <0.02 ng/mL
Troponin-I: 0.02 ng/mL

## 2011-07-21 LAB — CBC WITH DIFFERENTIAL/PLATELET
Basophil #: 0 10*3/uL (ref 0.0–0.1)
Basophil %: 0.3 %
HGB: 11.1 g/dL — ABNORMAL LOW (ref 12.0–16.0)
Lymphocyte %: 36.3 %
MCH: 30.5 pg (ref 26.0–34.0)
MCHC: 31.6 g/dL — ABNORMAL LOW (ref 32.0–36.0)
Monocyte #: 0.5 10*3/uL (ref 0.0–0.7)
Neutrophil %: 53.3 %
Platelet: 163 10*3/uL (ref 150–440)
RBC: 3.65 10*6/uL — ABNORMAL LOW (ref 3.80–5.20)

## 2011-07-21 LAB — BASIC METABOLIC PANEL
Anion Gap: 12 (ref 7–16)
Calcium, Total: 8.9 mg/dL (ref 8.5–10.1)
Chloride: 113 mmol/L — ABNORMAL HIGH (ref 98–107)
Co2: 22 mmol/L (ref 21–32)
Creatinine: 1.19 mg/dL (ref 0.60–1.30)
Osmolality: 295 (ref 275–301)

## 2011-07-23 LAB — CBC WITH DIFFERENTIAL/PLATELET
Eosinophil #: 0.1 10*3/uL (ref 0.0–0.7)
HCT: 34.1 % — ABNORMAL LOW (ref 35.0–47.0)
MCHC: 32.1 g/dL (ref 32.0–36.0)
MCV: 97 fL (ref 80–100)
Monocyte #: 0.4 10*3/uL (ref 0.0–0.7)
Monocyte %: 6.8 %
Neutrophil #: 3.6 10*3/uL (ref 1.4–6.5)
Neutrophil %: 59.6 %
Platelet: 164 10*3/uL (ref 150–440)
RDW: 16.4 % — ABNORMAL HIGH (ref 11.5–14.5)

## 2011-07-23 LAB — BASIC METABOLIC PANEL
Anion Gap: 11 (ref 7–16)
BUN: 14 mg/dL (ref 7–18)
Chloride: 112 mmol/L — ABNORMAL HIGH (ref 98–107)
Co2: 22 mmol/L (ref 21–32)
EGFR (African American): >60
EGFR (Non-African Amer.): 58 — ABNORMAL LOW
Glucose: 88 mg/dL (ref 65–99)
Osmolality: 289 (ref 275–301)
Sodium: 145 mmol/L (ref 136–145)

## 2011-07-23 LAB — URINE CULTURE

## 2011-07-26 LAB — AMMONIA: Ammonia, Plasma: 30 mcmol/L (ref 11–32)

## 2011-07-27 LAB — CREATININE, SERUM: Creatinine: 1.23 mg/dL (ref 0.60–1.30)

## 2011-08-30 ENCOUNTER — Emergency Department: Payer: Self-pay | Admitting: Emergency Medicine

## 2011-08-30 LAB — CBC
HCT: 34.9 % — ABNORMAL LOW (ref 35.0–47.0)
HGB: 11.4 g/dL — ABNORMAL LOW (ref 12.0–16.0)
MCH: 31.8 pg (ref 26.0–34.0)
WBC: 7.1 10*3/uL (ref 3.6–11.0)

## 2011-08-30 LAB — COMPREHENSIVE METABOLIC PANEL
Albumin: 3.2 g/dL — ABNORMAL LOW (ref 3.4–5.0)
Alkaline Phosphatase: 33 U/L — ABNORMAL LOW (ref 50–136)
Anion Gap: 11 (ref 7–16)
BUN: 14 mg/dL (ref 7–18)
Bilirubin,Total: 0.6 mg/dL (ref 0.2–1.0)
Calcium, Total: 9 mg/dL (ref 8.5–10.1)
Chloride: 110 mmol/L — ABNORMAL HIGH (ref 98–107)
Glucose: 87 mg/dL (ref 65–99)
Osmolality: 290 (ref 275–301)
Potassium: 4 mmol/L (ref 3.5–5.1)
SGOT(AST): 22 U/L (ref 15–37)
Sodium: 146 mmol/L — ABNORMAL HIGH (ref 136–145)
Total Protein: 7.1 g/dL (ref 6.4–8.2)

## 2011-08-30 LAB — TROPONIN I: Troponin-I: <0.02 ng/mL

## 2011-08-31 LAB — URINALYSIS, COMPLETE
Bilirubin,UR: NEGATIVE
Blood: NEGATIVE
Ketone: NEGATIVE
Leukocyte Esterase: NEGATIVE
Nitrite: NEGATIVE
Ph: 6 (ref 4.5–8.0)
Protein: NEGATIVE
RBC,UR: NONE SEEN /HPF (ref 0–5)
Squamous Epithelial: NONE SEEN

## 2012-01-14 ENCOUNTER — Ambulatory Visit: Payer: Self-pay | Admitting: Internal Medicine

## 2012-01-18 ENCOUNTER — Emergency Department: Payer: Self-pay | Admitting: Emergency Medicine

## 2012-01-18 LAB — URINALYSIS, COMPLETE
Glucose,UR: NEGATIVE mg/dL (ref 0–75)
Nitrite: NEGATIVE
RBC,UR: 1 /HPF (ref 0–5)
Squamous Epithelial: NONE SEEN
WBC UR: <1 /HPF (ref 0–5)

## 2012-02-03 ENCOUNTER — Inpatient Hospital Stay: Payer: Self-pay | Admitting: Internal Medicine

## 2012-02-03 LAB — CBC WITH DIFFERENTIAL/PLATELET
Basophil %: 1 %
Eosinophil %: 1.2 %
HGB: 12.4 g/dL (ref 12.0–16.0)
Lymphocyte #: 2 10*3/uL (ref 1.0–3.6)
MCH: 33.8 pg (ref 26.0–34.0)
MCV: 104 fL — ABNORMAL HIGH (ref 80–100)
Monocyte #: 0.5 x10 3/mm (ref 0.2–0.9)
Monocyte %: 7.3 %
Neutrophil %: 59.4 %
RBC: 3.65 10*6/uL — ABNORMAL LOW (ref 3.80–5.20)

## 2012-02-03 LAB — COMPREHENSIVE METABOLIC PANEL
Alkaline Phosphatase: 42 U/L — ABNORMAL LOW (ref 50–136)
Anion Gap: 7 (ref 7–16)
Bilirubin,Total: 1.1 mg/dL — ABNORMAL HIGH (ref 0.2–1.0)
Chloride: 118 mmol/L — ABNORMAL HIGH (ref 98–107)
Co2: 30 mmol/L (ref 21–32)
Creatinine: 3.41 mg/dL — ABNORMAL HIGH (ref 0.60–1.30)
EGFR (Non-African Amer.): 13 — ABNORMAL LOW
Osmolality: 325 (ref 275–301)
Potassium: 3.7 mmol/L (ref 3.5–5.1)
Sodium: 155 mmol/L — ABNORMAL HIGH (ref 136–145)

## 2012-02-03 LAB — URINALYSIS, COMPLETE
Bilirubin,UR: NEGATIVE
Blood: NEGATIVE
Ketone: NEGATIVE
Leukocyte Esterase: NEGATIVE
Ph: 5 (ref 4.5–8.0)
Specific Gravity: 1.025 (ref 1.003–1.030)
WBC UR: <1 /HPF (ref 0–5)

## 2012-02-03 LAB — TROPONIN I: Troponin-I: 0.03 ng/mL

## 2012-02-04 LAB — BASIC METABOLIC PANEL
Anion Gap: 9 (ref 7–16)
BUN: 66 mg/dL — ABNORMAL HIGH (ref 7–18)
BUN: 48 mg/dL — ABNORMAL HIGH (ref 7–18)
Co2: 27 mmol/L (ref 21–32)
Creatinine: 3.08 mg/dL — ABNORMAL HIGH (ref 0.60–1.30)
Creatinine: 2.08 mg/dL — ABNORMAL HIGH (ref 0.60–1.30)
EGFR (African American): 28 — ABNORMAL LOW
EGFR (Non-African Amer.): 24 — ABNORMAL LOW
Glucose: 84 mg/dL (ref 65–99)
Sodium: 155 mmol/L — ABNORMAL HIGH (ref 136–145)
Sodium: 156 mmol/L — ABNORMAL HIGH (ref 136–145)

## 2012-02-05 LAB — BASIC METABOLIC PANEL
Calcium, Total: 9.3 mg/dL (ref 8.5–10.1)
EGFR (African American): 33 — ABNORMAL LOW
EGFR (Non-African Amer.): 29 — ABNORMAL LOW
Glucose: 89 mg/dL (ref 65–99)
Osmolality: 309 (ref 275–301)
Potassium: 3.6 mmol/L (ref 3.5–5.1)
Sodium: 151 mmol/L — ABNORMAL HIGH (ref 136–145)

## 2012-02-06 LAB — BASIC METABOLIC PANEL
Anion Gap: 8 (ref 7–16)
BUN: 25 mg/dL — ABNORMAL HIGH (ref 7–18)
Calcium, Total: 9 mg/dL (ref 8.5–10.1)
Co2: 26 mmol/L (ref 21–32)
Creatinine: 1.49 mg/dL — ABNORMAL HIGH (ref 0.60–1.30)
EGFR (African American): 42 — ABNORMAL LOW
EGFR (Non-African Amer.): 36 — ABNORMAL LOW
Glucose: 99 mg/dL (ref 65–99)

## 2012-02-07 LAB — BASIC METABOLIC PANEL
Anion Gap: 10 (ref 7–16)
BUN: 17 mg/dL (ref 7–18)
Chloride: 105 mmol/L (ref 98–107)
EGFR (African American): 53 — ABNORMAL LOW
Glucose: 89 mg/dL (ref 65–99)
Osmolality: 282 (ref 275–301)

## 2012-02-14 ENCOUNTER — Ambulatory Visit: Payer: Self-pay | Admitting: Internal Medicine

## 2012-03-15 DEATH — deceased

## 2012-03-30 IMAGING — CR DG CHEST 1V PORT
1 series · 1 of 1 positions shown · non-contrast
Comparison: none

REASON FOR EXAM: syncope
COMMENTS:

[portable]
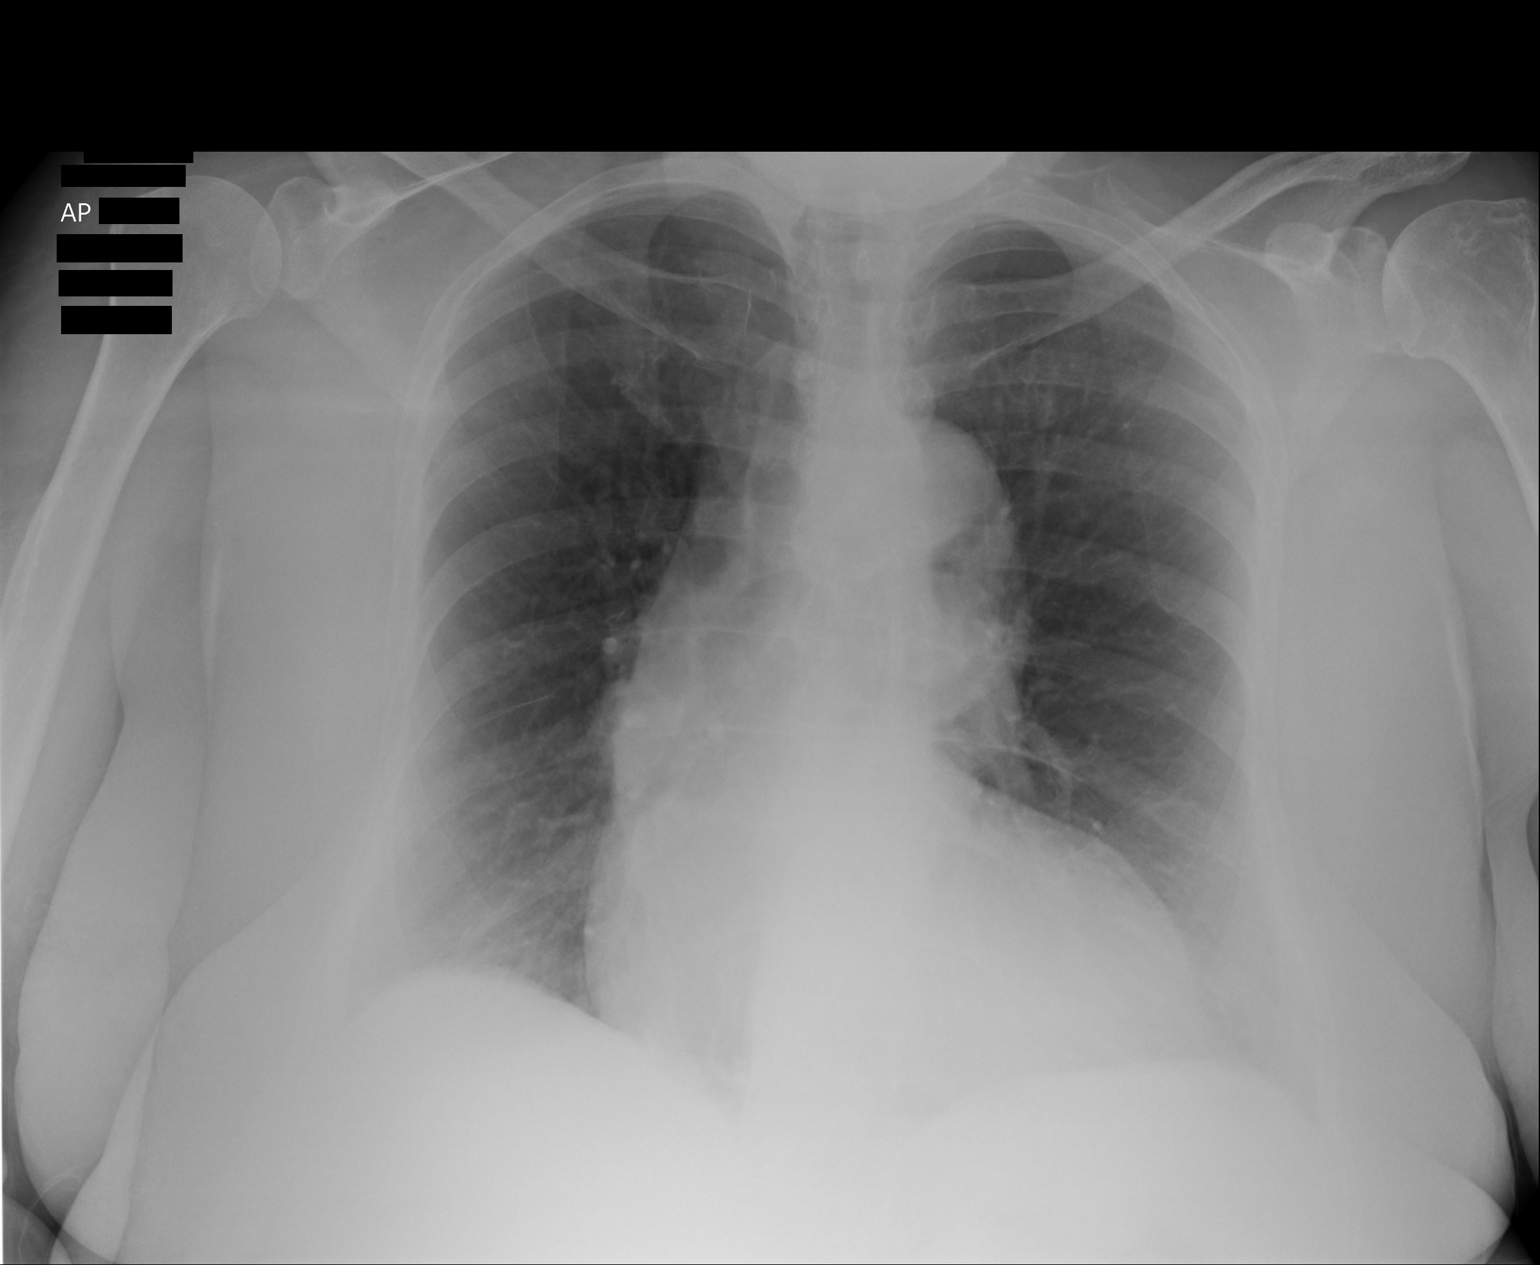

[1 of 1 positions shown; findings below may reference images not displayed]

PROCEDURE:     DXR - DXR PORTABLE CHEST SINGLE VIEW  - June 11, 2011  [DATE]

RESULT:     Comparison is made to the study April 28, 2011.

The lungs are mildly hyperinflated but clear. The cardiac silhouette remains
mildly enlarged. There is stable tortuosity of the ascending and descending
thoracic aorta. There is no pleural effusion. The mediastinum is normal in
width.
IMPRESSION: There is mild hyperinflation which likely reflects COPD.
There is no evidence of pneumonia. There is mild enlargement cardiac
silhouette which is stable. No pulmonary vascular congestion is evident.

## 2012-04-07 IMAGING — CR DG CHEST 1V PORT
1 series · 1 of 1 positions shown · non-contrast
Comparison: none

REASON FOR EXAM: ? LOC/ found on floor in room unwitnessed   13
COMMENTS:

[portable]
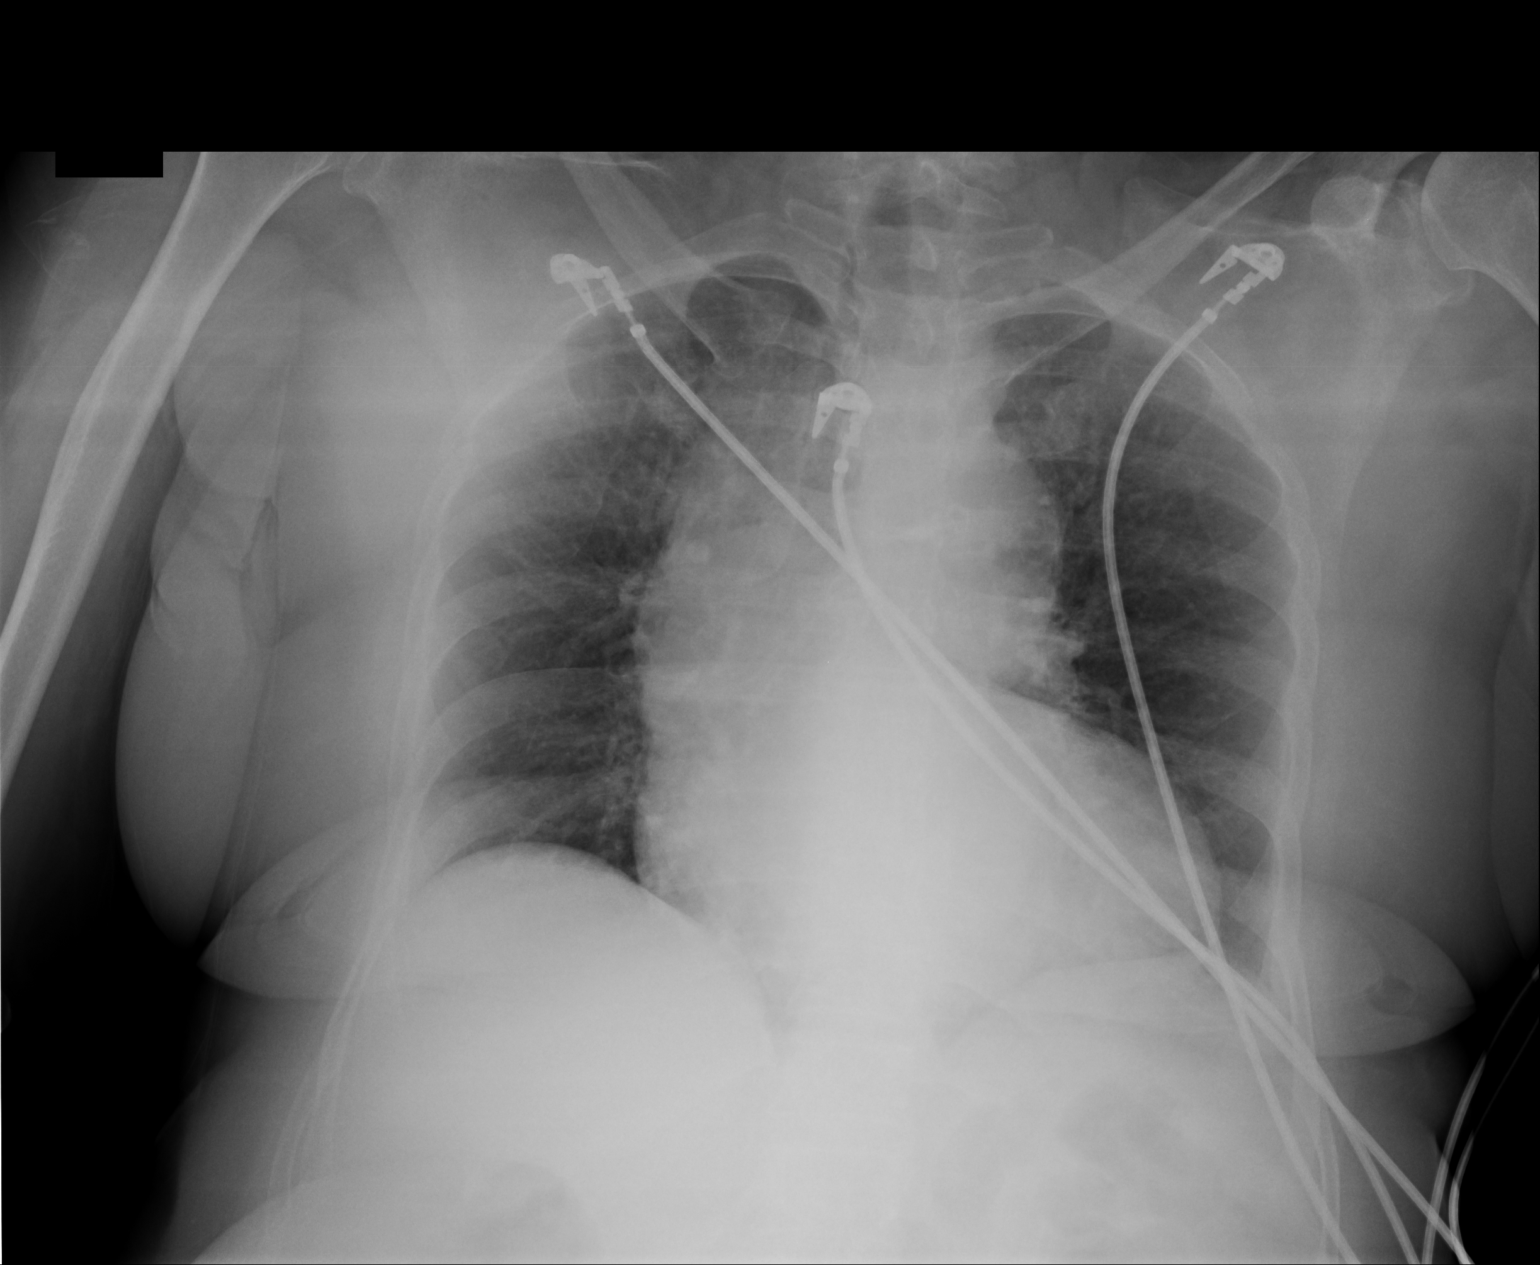

[1 of 1 positions shown; findings below may reference images not displayed]

PROCEDURE:     DXR - DXR PORTABLE CHEST SINGLE VIEW  - June 19, 2011  [DATE]

RESULT:     Comparison is made to the study dated 11 June, 2011.

The projection is somewhat lordotic. The cardiac silhouette is borderline
enlarged. The lungs appear clear. The bony and mediastinal structures are
unremarkable. There is no edema, infiltrate, effusion or mass evident.
IMPRESSION: 1. Borderline cardiomegaly. No acute cardiopulmonary disease evident.

## 2012-05-06 IMAGING — CT CT HEAD WITHOUT CONTRAST
2 of 4 series · 16 of 30 positions shown, 19 images · non-contrast
Comparison: none

REASON FOR EXAM: ams
COMMENTS:

PROCEDURE:     CT  - CT HEAD WITHOUT CONTRAST  - July 19, 2011 [DATE]
RESULT:     Comparison:  07/01/2011
TECHNIQUE: Multiple axial images from the foramen magnum to the vertex were
obtained without IV contrast.

[Series 3: without · axial · non-contrast · 0.46mm/px · z∈[+385,+530]mm · 11 of 35 slices shown, 14 images]
[im 3/35  brain]
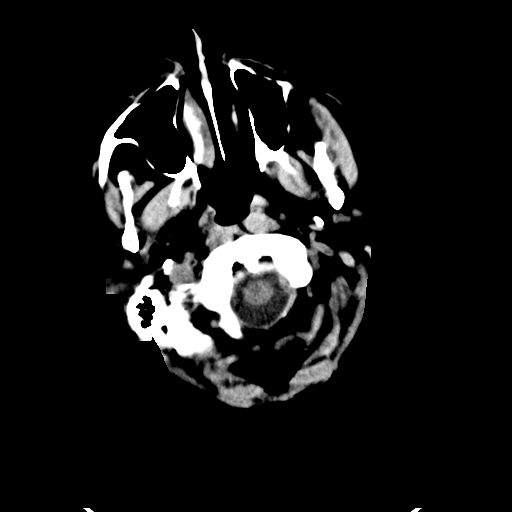
[im 3/35  bone]
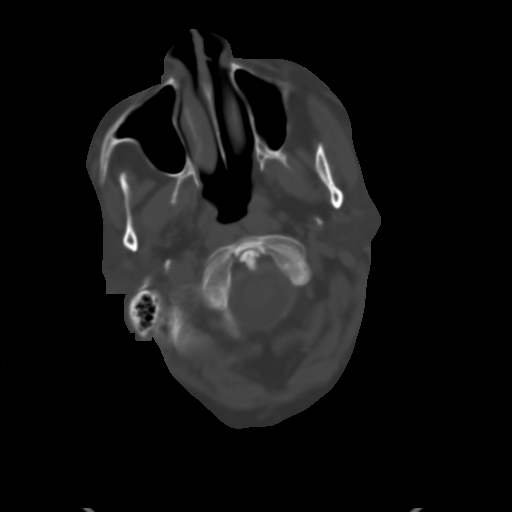
[im 6/35  brain]
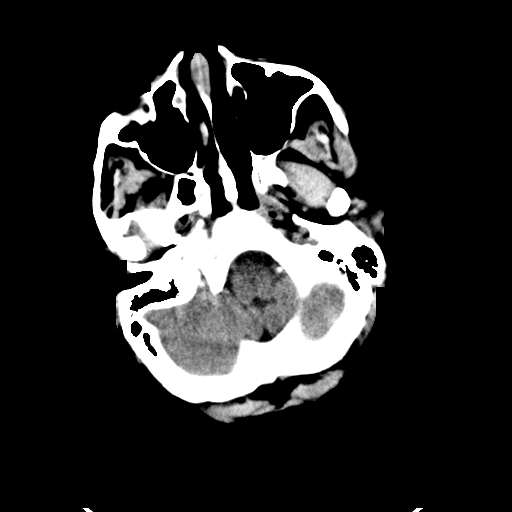
[im 9/35  brain]
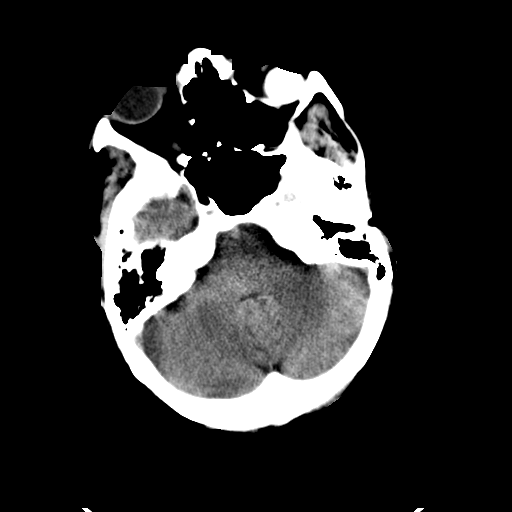
[im 12/35  brain]
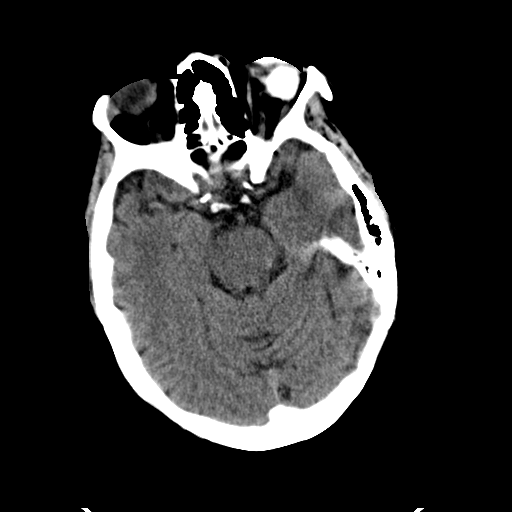
[im 15/35  brain]
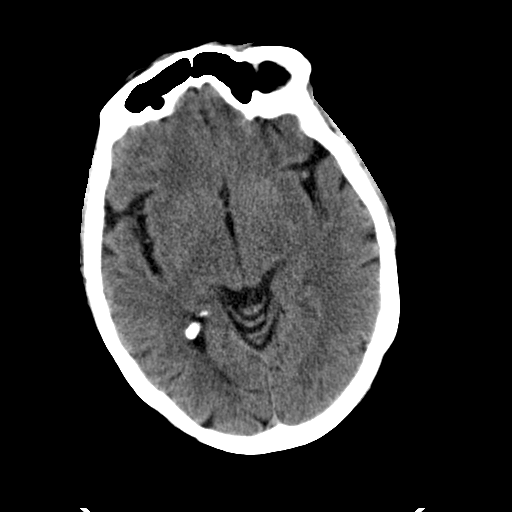
[im 15/35  bone]
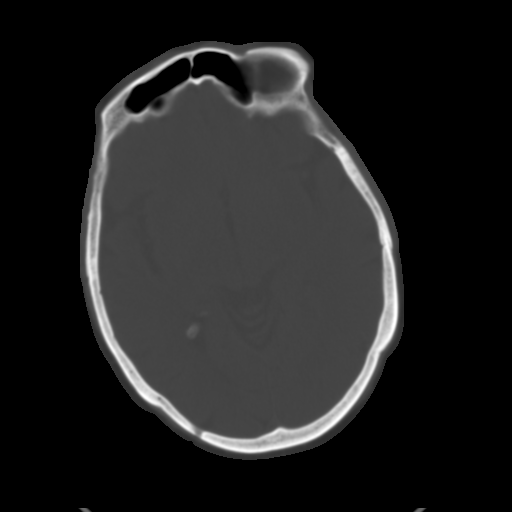
[im 18/35  brain]
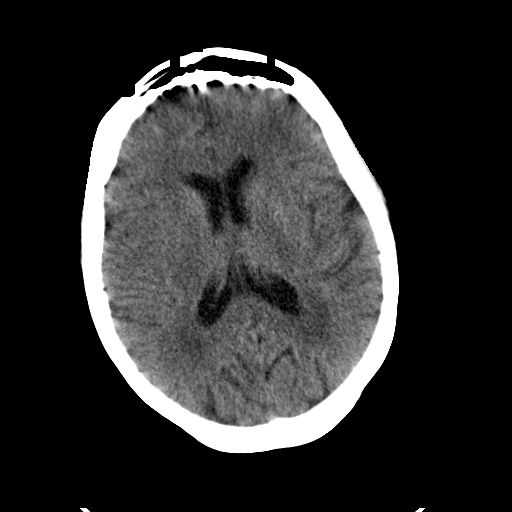
[im 20/35  brain]
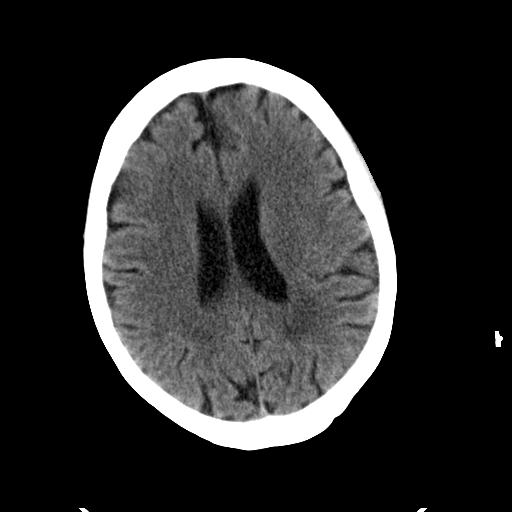
[im 23/35  brain]
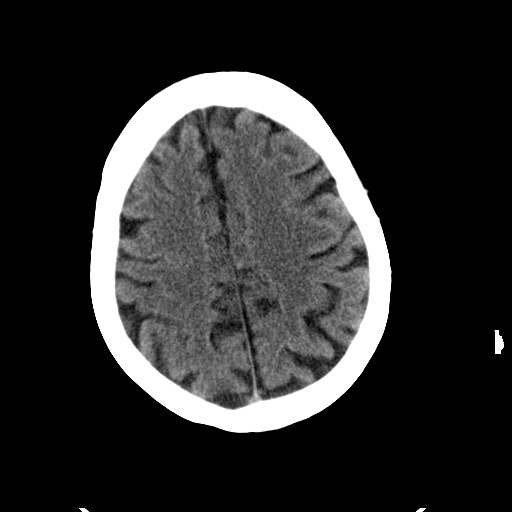
[im 26/35  brain]
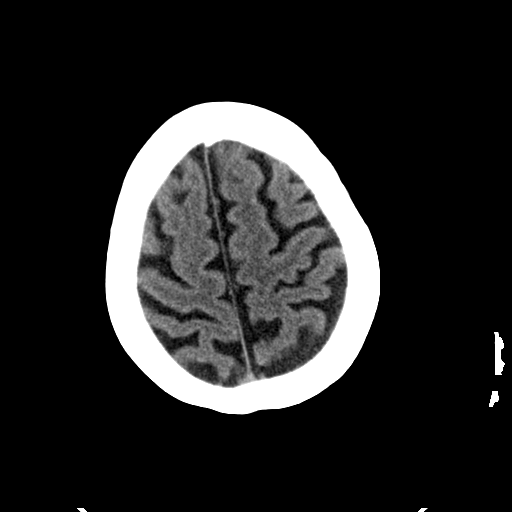
[im 26/35  bone]
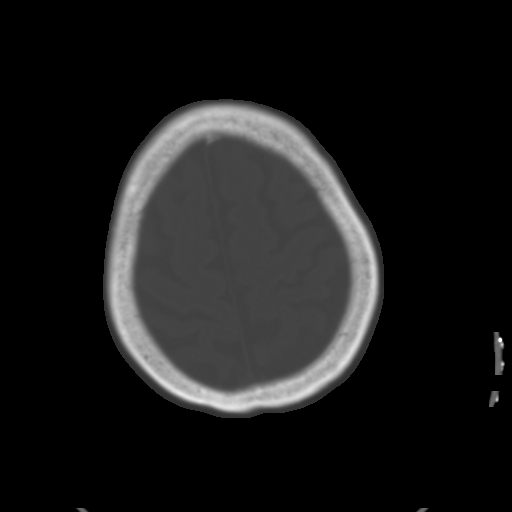
[im 29/35  brain]
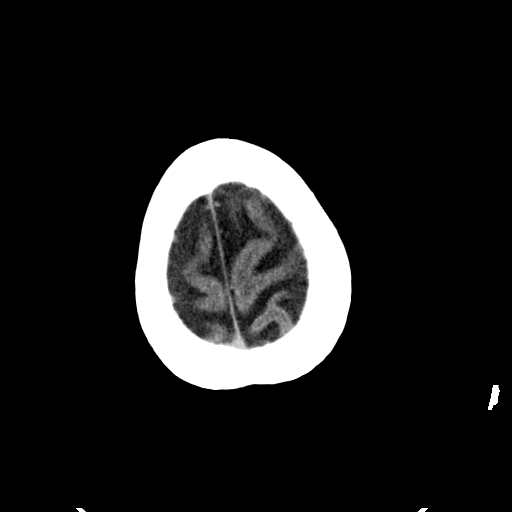
[im 32/35  brain]
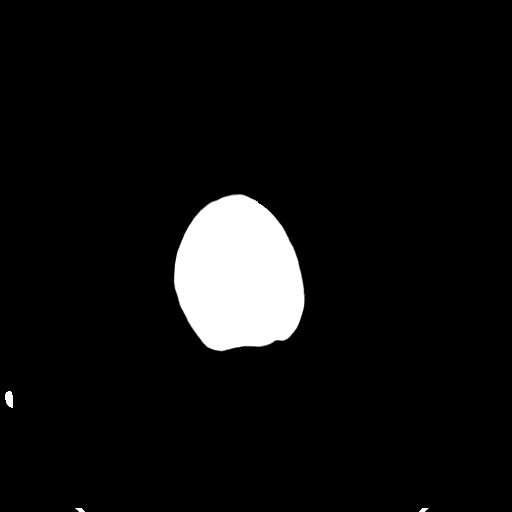

[Series 4: bone · axial · 0.46mm/px · z∈[+385,+470]mm · 5 of 35 slices shown]
[im 3/35  bone]
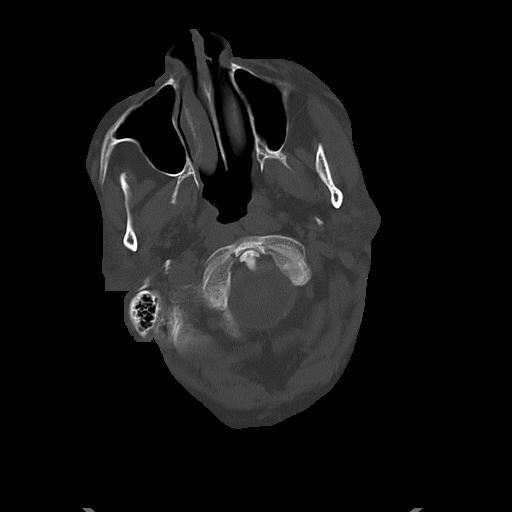
[im 9/35  bone]
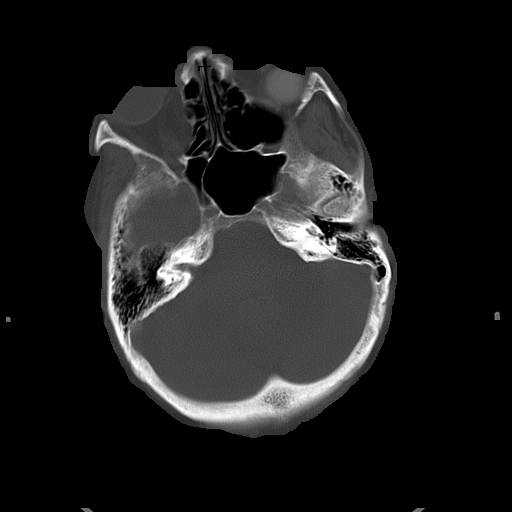
[im 12/35  bone]
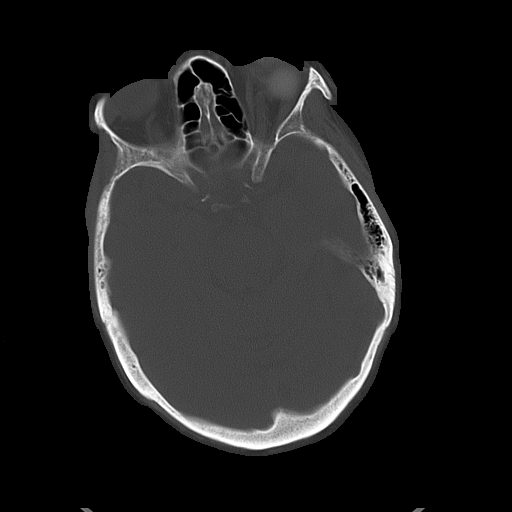
[im 15/35  bone]
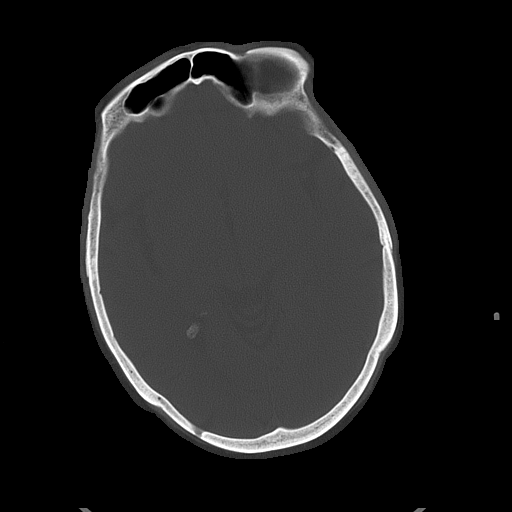
[im 20/35  bone]
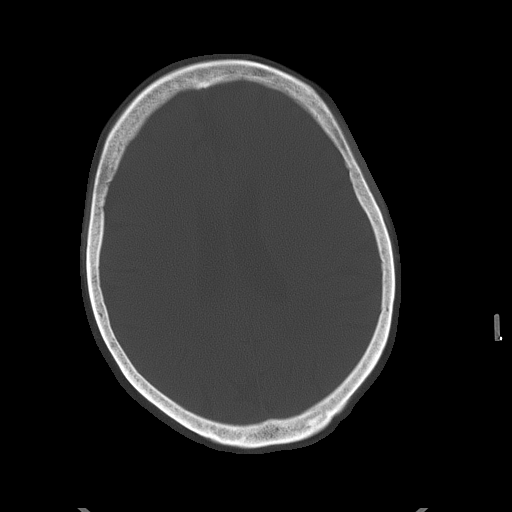

[16 of 30 positions shown; findings below may reference images not displayed]

FINDINGS: There is no evidence of mass effect, midline shift, or extra-axial fluid
collections.  There is no evidence of a space-occupying lesion or
intracranial hemorrhage. There is no evidence of a cortical-based area of
acute infarction. There is generalized cerebral atrophy. There is
periventricular white matter low attenuation likely secondary to
microangiopathy.

The ventricles and sulci are appropriate for the patient's age. The basal
cisterns are patent.

Visualized portions of the orbits are unremarkable. The visualized portions
of the paranasal sinuses and mastoid air cells are unremarkable.
Cerebrovascular atherosclerotic calcifications are noted.

The osseous structures are unremarkable.
IMPRESSION: No acute intracranial process.

## 2014-10-02 NOTE — H&P (Signed)
PATIENT NAME:  Amber [name], Amber [name] MR#:  [id number] DATE OF BIRTH:  24-Sep-1945  DATE OF ADMISSION:  02/03/2012  PRIMARY CARE PHYSICIAN: Dr. Dario GuardianJadali.   CHIEF COMPLAINT: Not eating, not drinking, did not have any bowel movement or urination since Monday.  HISTORY OF PRESENT ILLNESS: [name] is a 69 year old African American female who was discharged recently on Monday from the nursing home and taken home with the family. Since that time they noticed that she did not make any urine nor did she have a bowel movement and there is decreased oral intake. They contacted their primary care physician who ordered to take the patient to the Emergency Department. The patient has advanced dementia and does not provide much history. Evaluation here reveals severe dehydration, hypernatremia and evidence of acute renal failure. The patient was admitted for further evaluation and management. Additionally, she had a KUB x-ray which showed evidence of constipation.   REVIEW OF SYSTEMS: Ten point system review is unobtainable due to the patient's advanced dementia.   PAST MEDICAL HISTORY:  1. Dementia. 2. Hypertension. 3. Coronary artery disease, status post myocardial infarction and stenting.  4. History of cerebrovascular accident and transient ischemic attacks. 5. History of liver cirrhosis in association with alcoholism and hepatitis [name]. The patient is ex-chronic alcoholic.  6. History of tobacco abuse. She quit a year ago. 7. History of depression. 8. History of suicidal ideation.  9. Seizure disorder. 10. Chronic obstructive pulmonary disease. 11. Gout. 12. Arthritis. 13. 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  14. History of left vertebral artery aneurysmal dilation.  15. History of right knee swelling and knee prosthesis.  16. Left eye prosthesis.  17. History of peripheral neuropathy and chronic debilitation.   SOCIAL HABITS: Ex-chronic smoker. She quit on Thanksgiving a  year ago. Ex-chronic alcoholic. She quit about 20 years ago.   FAMILY HISTORY: Both parents had hypertension. There is no family history of dementia.   SOCIAL HISTORY: She is widowed, was just transferred from the nursing home to live at home with her daughter. This happened just two days ago.   ADMISSION MEDICATIONS:  1. Zyprexa 5 mg twice a day. 2. Geodon 60 mg twice a day. 3. Trazodone 50 mg once a day at night.  4. Spiriva 1 inhalation once a day. 5. Plavix 75 mg a day. 6. Protonix 40 mg a day.  7. Nuedexta 20/10 twice a day. 8. Lamotrigine 200 mg twice a day. 9. Lactulose 30 milliliters 3 times a day. 10. Flonase two sprays in each nostril once a day. 11. Duragesic patch 12 mcg one patch every three days.  12. Carvedilol or Coreg 25 mg twice a day. 13. Ativan 0.5 mg once a day.   ALLERGIES: Penicillin, Celebrex. Also reported Darvocet-N 100 causing gastrointestinal distress. It was mentioned that she is allergic to Tylenol but she is not. It is just a precaution because of her underlying liver cirrhosis.   PHYSICAL EXAMINATION:  VITAL SIGNS: Blood pressure 136/76, respiratory rate 18, pulse 74, temperature 98.7, oxygen saturation 90%.   GENERAL APPEARANCE: Elderly frail thin-looking female lying in bed in no acute distress.   HEAD: No pallor. No icterus. No cyanosis.   ENT: Hearing was difficult to assess due to the patient's dementia, does not respond to questions. Nasal mucosa, lips, tongue were unremarkable except for dry mucous membranes.   EYES: In the left eye she has a prosthesis. The right eye was difficult to examine as the patient is resisting.  NECK: Supple. Trachea at midline. No thyromegaly. No cervical lymphadenopathy.   HEART: Distant heart sounds. Faint S1 and S2. No S3 or S4. No murmur. No gallop. No carotid bruits.   RESPIRATORY: Normal breathing pattern without use of accessory muscles. No rales. No wheezing.   ABDOMEN: Soft without rigidity. No  masses. No hepatosplenomegaly. No hernias.   SKIN: No ulcers. No subcutaneous nodules.   MUSCULOSKELETAL: No joint swelling. No clubbing.   NEUROLOGIC: Cranial nerves grossly intact but limited exam due to the patient not cooperating. She moves her upper and lower extremities in bed. She is lethargic.   PSYCHIATRIC: Unable to perform due to the patient's advanced dementia. She does not respond to questions nor looking in the eyes.   LABORATORY, DIAGNOSTIC, AND RADIOLOGICAL DATA: KUB x-ray was consistent with bowel gas pattern suggesting constipation. Her EKG showed normal sinus rhythm at rate of 78 per minute. Poor progression of R waves in the anterior chest leads. Otherwise, unremarkable EKG. Serum glucose 77, BUN 66, creatinine 3.4. Her baseline creatinine in March of this year was 1.01. Serum sodium 155, potassium 3.7. GFR is estimated at 16. Total protein 7.5, albumin was 3.3, bilirubin 1.1, alkaline phosphatase 42, ALT 17, AST 37. Troponin 0.03. CBC showed white count 6,000, hemoglobin 12, hematocrit 37, platelet count 131. Urinalysis showed cloudy urine, but has only one white blood cell.   ASSESSMENT:  1. Acute renal failure likely from prerenal azotemia from severe dehydration.  2. Severe dehydration.  3. Hypernatremia.  4. Mild thrombocytopenia.  5. Advanced dementia.  6. Systemic hypertension.  7. Hyperlipidemia.  8. Coronary artery disease.  9. History of strokes and transient ischemic attacks. 10. Liver cirrhosis.  11. History of hepatitis [name].  12. Ex-chronic alcoholic.  13. History of tobacco abuse. 14. History of depression and suicidal ideation.  15. Chronic obstructive pulmonary disease. 16. Seizure disorder. 17. Gout.   PLAN:  1. We will admit the patient to the medical floor.  2. Start aggressive IV hydration with normal saline.  3. Follow up on the BMP to ensure that her BUN and creatinine are improving and also her sodium as well. If hypernatremia persists, then  we may need to change her normal saline to half normal saline.  4. Intensify treatment with the lactulose to resolve her constipation. I will add MiraLAX 17 grams a day.  5. Continue the home medications as listed above if the patient is in shape to swallow her medications. Hopefully, she will improve with time. We may need to hold some of her medications as she is already lethargic. Nuedexta, I did not find this medication the formulary of this hospital. I spoke with the patient's daughter regarding code status. She confirms that her mother's code is DO NOT RESUSCITATE. Also, she mentioned that she was given the power of attorney to her, the daughter. Her name is Rene Kocher and also to her sister and brother, Luster Landsberg and Casimiro Needle, respectively.          TIME SPENT IN EVALUATING THIS PATIENT AND REVIEWING HER MEDICAL RECORDS: More than 55 minutes.    ____________________________ Carney Corners. Rudene Re, MD amd:ap D: 02/03/2012 22:57:56 ET T: 02/04/2012 07:14:58 ET JOB#: [id number]  cc: Carney Corners. Rudene Re, MD, <Dictator> Marlyn Corporal, MD Karolee Ohs Dala Dock MD ELECTRONICALLY SIGNED 02/04/2012 22:23

## 2014-10-02 NOTE — Discharge Summary (Signed)
PATIENT NAME:  Amber [name], MEHAN MR#:  [id number] DATE OF BIRTH:  04/09/1946  DATE OF ADMISSION:  02/03/2012 DATE OF DISCHARGE:  02/08/2012  DISPOSITION: Home.   DISCHARGE DIAGNOSES:  1. Acute renal failure due to dehydration, poor p.o. intake.  2. Hypernatremia secondary to dehydration.  3. Hypertension.  4. Chronic obstructive pulmonary disease. 5. Dementia with depression.  6. History of cerebrovascular accident.  7. Cirrhosis.    CODE STATUS:  DO NOT RESUSCITATE.  The patient's daughter advised if the patient is oriented to place, person and wants to take medication she can get it, otherwise she does not have to get it because the patient has advanced dementia and DO NOT RESUSCITATE.     DISCHARGE MEDICATIONS:  1. Flonase 2 sprays in each nostril once a day.  2. Spiriva 80 mcg inhalation once a day. 3. Lactulose as needed for constipation.  4. Pantoprazole 40 mg p.o. daily.  5. Duragesic patch 12 mg every 3 days.  6. Plavix 75 mg daily.  7. Trazodone 50 mg at bedtime.  8. Ativan 0.5 mg in the morning. 9. Lamictal 200 mg p.o. b.i.d.   10. Foot drop splints. 11. Geodon 60 mg p.o. b.i.d. with meals.  12. Olanzapine 2.5 mg every 8 hours for agitation.  13. Coreg 3.25 mg p.o. b.i.d.  Bisacodyl suppositories.     DIET: Low sodium diet. Puree nectar-thick liquids, medications in puree. Aspiration precautions. FOLLOWUP:  Follow up with Dr. Dario Guardian in 1 to 2 weeks.   CONSULTATIONS: Palliative Care consult with Dr. Harvie Junior.   HOSPITAL COURSE: The patient is a 69 year old female admitted on 02/03/2012 because of poor p.o. intake with decreased urination and also no bowel movements with decreased p.o. intake. The patient's family contacted Dr. Dario Guardian, who advised her to go to the Emergency Room. In the Emergency Room, the patient was found to have severe dehydration with hyponatremia and acute renal failure. The patient has advanced dementia, and the family was unable to feed the patient.  When the patient came in, KUB was consistent with some constipation. EKG was normal sinus rhythm with poor R wave progression. The patient's labs showed sodium 155, potassium 3.7, and also her creatinine was 3.4, and in March of this year it was 1.01, and BUN was 66. She was admitted for acute renal failure with dehydration, hypernatremia. The patient was started on IV fluids, normal saline at 150 mL/hr, then IV fluids were changed to D5 0.5 normal saline. The patient's mental status much improved. Also, she did have some agitation spells and was started on some Haldol p.r.n. The patient still was not able to take p.o. medications. Psychiatry consult was obtained. The patient was seen by Dr. Caryn Section. She recommended Zyprexa Zydis 5 mg, which is orally disintegrated, every 6 to 8 hours p.r.n.; so we gave a prescription for Zyprexa orally disintegrating, and she was on Zydis as scheduled as well. She was on 2.5 mg every 8 hours of Zyprexa as needed for agitation. This was given as a prescription. The patient actually improved with Zyprexa, and also her sodium improved to 141, potassium 3.4, and creatinine back to 1.24 on August 25th. The patient's family decided to take her home, and Palliative Care consult was obtained with Dr. Harvie Junior. The patient was taking sips and bites, and the patient's daughter wanted to take her home. The patient's CODE STATUS is  DO NOT RESUSCITATE, and she was sent home with Hospice.   She has diagnoses of hypertension, hyperlipidemia,  chronic kidney disease, seizure disorder, chronic obstructive pulmonary disease, history of cirrhosis, hepatitis C virus, and coronary artery disease with history of myocardial infarction and stent   TIME SPENT ON DISCHARGE PREPARATION: More than 30 minutes.  ____________________________ Katha HammingSnehalatha Lexus Shampine, MD sk:cbb D: 02/17/2012 10:25:30 ET T: 02/18/2012 15:16:26 ET JOB#: [id number] cc: Katha HammingSnehalatha Daria Mcmeekin, MD, <Dictator> Marlyn CorporalFayegh H. Jadali,  MD Katha HammingSNEHALATHA Ruey Storer MD ELECTRONICALLY SIGNED 02/25/2012 17:53

## 2014-10-02 NOTE — Consult Note (Signed)
PATIENT NAME:  Amber [name], Amber [name] MR#:  [id number] DATE OF BIRTH:  11/19/1945  DATE OF CONSULTATION:  02/05/2012  REFERRING PHYSICIAN:  Dr. Jacques Navy  CONSULTING PHYSICIAN:  Doralee Albino. Maryruth Bun, MD  REASON FOR CONSULTATION: Altered mental status and agitation.   HISTORY OF PRESENT ILLNESS:  Amber [name] is a 69 year old currently separated African American female with a history of bipolar disorder as well as dementia and multiple medical problems including a history of multiple strokes in the past as well as cirrhosis of the liver who was admitted to the medicine service secondary to worsening altered mental status, lethargy, and increasing confusion. In addition, the patient has had decreased oral intake and has not had a bowel movement or made any urine in several days. The patient had just been taken out of a skilled nursing facility, [facility], by the patient's daughter as the daughter felt that she was not getting adequate care at [facility]. The patient is currently on Geodon 60 mg p.o. b.i.d. and Zyprexa 5 mg p.o. b.i.d. as well as Lamictal 200 mg p.o. b.i.d. for mood stabilization and psychosis. Psychiatry was consulted to help with management of psychotropic medications for agitation. In addition, palliative care was also consulted. During the interview the patient was quite lethargic and slumped over in the bed. Speech was mumbled and difficult to understand. The patient was able to say hello but was not oriented to place, situation, or time. She was unable to answer questions appropriately. She did not appear to be actively psychotic or agitated at the present time, but per family sitting at bedside, her two sisters, she had been agitated earlier during the day and received Haldol 1 mg at 2:30 a.m. this morning. All of her other psychotropic medications have been stopped secondary to lethargy. The patient does have a long history of problems with dementia and worsening mental status over the past  one year. She has been in Hospice care before but was discharged from [facility] care in early July. Although she has had difficulty with paranoid thoughts and hallucinations, hallucinations have decreased per the family but she has become more lethargic and is frequently dehydrated. No suicidal thoughts have been verbalized, but the patient has not been able to swallow or take any p.o. pills recently.   PAST PSYCHIATRIC HISTORY: Per prior records, the patient was hospitalized at [facility] on the psychiatry service for severe depression in the past.  She does have a history of two suicide attempts in the past by overdose and was hospitalized at [facility] for polysubstance dependence as well. She had been seeing a psychiatric nurse practitioner at [facility] prior to admission before being taken out of the facility. She has had trials of Depakote, lithium, Zoloft, Paxil, and Prozac. She is currently on Lamictal 200 mg p.o. daily, Geodon 60 mg p.o. b.i.d., and Zyprexa 5 mg p.o. b.i.d. She does also have a history of polysubstance dependence including heavy use of alcohol, cocaine, cannabis, and IV heroin use more than 20 years ago.   FAMILY PSYCHIATRIC HISTORY: There are two maternal aunts with Alzheimer's dementia.   PAST MEDICAL HISTORY:  1. Hypertension.  2. Hyperlipidemia.  3. Progressive dementia diagnosed in 2011.  4. Coronary artery disease status post myocardial infarct and stenting. 5. History of  transient ischemic attacks and cerebrovascular accident.  6. Seizure disorder.  7. Arthritis.  8. Gout. 9. Chronic obstructive pulmonary disease. 10. History of common femoral artery nonfilling dissection. 11. History of left vertebral  artery aneurysm dilation.  12. Right knee swelling. 13. History of knee prosthesis. 14. History of left eye prosthesis. 15. History of chronic ventricular fibrillation. 16. History of neuropathy.   ALLERGIES: Darvocet, penicillin, Tylenol, and Celebrex.   CURRENT  MEDICATIONS:  1. Coreg 25 mg p.o. b.i.d. on hold.  2. Flonase nasal spray two sprays daily.  3. Lactulose 30 mL t.i.d. on hold. 4. Lamictal 200 mg p.o. b.i.d.  5. Geodon 60 mg p.o. b.i.d.  6. Trazodone 50 mg p.o. at bedtime.  7. Aspirin 300 mg rectal daily.  8. Plavix 75 mg p.o. daily, on hold.  9. Spiriva on hold. 10. Haldol 1 mg IV push every four hours p.r.n. for agitation.   SOCIAL HISTORY: The patient was a resident of [facility] until she was recently taken out of the facility by her daughter. She has a very supportive of family and her daughter Luster Landsberg is healthcare power of attorney. At this time the family is interested in the patient having Hospice services.  REVIEW OF SYSTEMS: Unable to obtain secondary to the patient's altered mental status.   MENTAL STATUS EXAM: Amber [name] is a 68 year old cachectic African American female who was laying in her hospital bed. When she sat up she was slightly slumped over.  The patient's eyes were half closed.  Speech was mumbled and difficult to understand. She was disoriented to time, place, and situation and was not able answer questions appropriately. She did not appear to be actively psychotic at the present time but insight and judgment were poor. She was not able answer questions with regards to memory and recall secondary to altered mental status and lethargy.  SUICIDE RISK ASSESSMENT: At this time the patient is not verbalizing any thoughts, but secondary to altered mental status remains a chronic danger to herself or others especially given limited p.o. intake.   VITAL SIGNS: Blood pressure 153/95, heart rate 69, respirations 18, temperature 97.3. Please see initial physical exam as completed by admitting physician, Dr. Rudene Re.   LABORATORY, DIAGNOSTIC, AND RADIOLOGICAL DATA: Sodium 151, potassium 3.6, chloride 117, CO2 27, BUN 40, creatinine 1.81, glucose 89, white blood count 6.3, hemoglobin 12.4, platelet count 131. Troponin 0.03.  TSH 1.82, alkaline phosphatase 42, AST 37, ALT 17. Urinalysis is nitrite and leukocyte esterase negative, less than one WBC, trace bacteria. EKG showed a ventricular rate of 78 with a QTc interval of 476.   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 Rule out delirium secondary to dehydration. History of polysubstance dependence, in full remission.  History of major depression, recurrent.   AXIS II: Deferred.   AXIS III: Alcoholic liver disease. Hepatitis C. Dyslipidemia. Hypertension. Coronary artery disease. History of multiple cerebrovascular accidents. Chronic obstructive pulmonary disease. Seizure disorder. Arthritis.   AXIS IV: Severe inability to care for self.   AXIS V: GAF at present equals 15.   ASSESSMENT AND TREATMENT RECOMMENDATIONS: Ms. Rigaud is a 69 year old African American female with a history of dementia, now endstage, Alzheimer's type who was brought to the Emergency Room secondary to decreased p.o. intake and increased lethargy. Psychiatry was consulted to help with psychotropic medication management for agitation. The patient did meet with Dr. Harvie Junior from palliative care this morning and is interested in palliative care services. All of her p.o. psychotropic medications have been stopped for now as the patient is unable to swallow as well as lethargy. Would recommend adding Zyprexa Zydis tablet 5 mg which is a disintegrating tablet placed  on the tongue p.o. every 6-8 eight hours p.r.n. for agitation for now. If the patient does improve and is not as lethargic, one could consider restarting scheduled psychotropic medications; however, per my conversations with her daughter the family plans to take the patient home and is interested in home Hospice care. We will consult with Dr. Harvie JuniorPhifer as well as Dr. Jacques NavyAhmadzia,  but would recommend only p.r.n. Zydis Zyprexa for now to help with agitation if needed given the fact that the patient would not  have IVs at home. The risks, benefits, and alternatives to treatment were discussed with the patient's daughter and healthcare power of attorney. She was supportive and in agreement with the plan.    ____________________________ Doralee AlbinoAarti K. Maryruth BunKapur, MD akk:bjt D: 02/05/2012 15:49:10 ET T: 02/05/2012 16:44:22 ET JOB#: [id number]  cc: Cormac Wint K. Maryruth BunKapur, MD, <Dictator> Darliss RidgelAARTI K Brenen Beigel MD ELECTRONICALLY SIGNED 02/09/2012 10:02

## 2014-10-07 NOTE — Discharge Summary (Signed)
PATIENT NAME:  Amber [name], Amber [name] MR#:  [id number] DATE OF BIRTH:  1945-12-04  DATE OF ADMISSION:  07/19/2011 DATE OF DISCHARGE:  07/27/2011  ADMITTING DIAGNOSIS:  Altered mental status.   DISCHARGE DIAGNOSES:  1. Unclear encephalopathy at this time, questionable delirium versus advanced dementia with behavioral problems, agitation, resolved on medications.  2. Oral candidiasis.  3. Pyuria and urine cultures positive for coagulase-negative Staphylococcus and Enterococcus faecalis.  4. History of liver cirrhosis.  5. Hepatitis C.  6. Former alcohol abuse.  7. History of coronary artery disease, status post myocardial infarction and stent placement in the past, stable.  8. History of seizure disorder.  9. Chronic obstructive pulmonary disease.  10. Hyperlipidemia, stable.    DISCHARGE CONDITION: Stable.   DISCHARGE MEDICATIONS: The patient is to resume her outpatient medications which are:  1. Lactulose 30 mL 3 times daily. 2. Plavix 75 mg p.o. daily.  3. Aspirin 81 mg p.o. daily. 4. Fluticasone two sprays each nostril daily. 5. Spiriva 1 capsule inhaler daily. 6. Ventolin 2 puffs 3 times daily as needed. 7. Nitrostat 0.4 mg sublingually every five minutes as needed.  8. Carvedilol 25 mg p.o. twice daily. 9. Simvastatin 40 mg p.o. daily. 10. Lamotrigine 200 mg p.o. twice daily.  11. Pantoprazole 40 mg p.o. daily.   ADDITIONAL MEDICATIONS:  1. Geodon 40 mg p.o. twice daily.  2. Tramadol 25 mg p.o. twice daily, this is a new dose.  3. Haldol 2 mg every four hours as needed.  4. Trazodone 50 mg p.o. 3 times daily.  5. Nitrofurantoin 50 mg p.o. every six hours for eight more days. 6. Nystatin 5 to 10 mL every six hours swish and swallow.   DIET: 2 grams salt, mechanical soft, low fat, low cholesterol.   PHYSICAL ACTIVITY LIMITATIONS: As tolerated.   REFERRALS: Physical therapy 2 to 7 times a week.     FOLLOWUP: Follow-up appointment with Dr. Shawn Stall two days after  discharge.    CONSULTANTS:  1. Care management.  2. Dr. Jennet Maduro.   RADIOLOGIC STUDIES:  1. Chest, portable single view, 07/18/2011: No acute disease of the chest. 2. CT scan of the head without contrast 07/19/2011: No acute intracranial process.  HISTORY OF PRESENT ILLNESS:  The patient is a 69 year old female with past medical history significant for history of dementia who presented to the hospital from [facility] with behavioral problems on 07/17/2011. She was initiated on Geodon as well as Haldol and trazodone. However, she required admission to the medical service because of concerns of delirium due to urinary tract infection.  Please refer to Dr. Darden Amber admission note on 07/19/2011. On arrival to the Emergency Room, the patient's temperature was 98, pulse 76, respiration rate 18, blood pressure 124/69, saturation was 93% on room air. Physical exam revealed faint basilar crackles on lung exam, otherwise unremarkable physical exam.  LABORATORY DATA:  On 07/17/2011: BUN/creatinine 35/1.49, otherwise unremarkable BMP. The patient's liver enzymes were normal. Cardiac enzymes, first set as well as subsequent two more sets, were within normal limits. TSH was normal at 2.95. Urine drug screen was negative. White blood cell count was normal at 7.4, hemoglobin 11.7, platelets 192. Coagulation panel was unremarkable. Urinalysis on 07/18/2011 showed yellow cloudy urine negative for glucose, bilirubin, or ketones. Specific gravity was 1.015, pH 6, negative for blood, protein, nitrites. Trace leukocyte esterase was noted. One red blood cell, seven white blood cells. No bacteria. Less than one epithelial cell and mucus present. Acetaminophen level was less than  2 and salicylate level less than 1.7.   HOSPITAL COURSE: 1. Initially the patient was felt to have some dehydration and was started on IV fluids and treated with Geodon as well as trazodone in the Emergency Room. However, because of concern  of urinary tract infection, the patient was admitted by the medical service and started on antibiotic therapy for urinary tract infection. The patient's urine cultures, however, revealed more than 100,000 colony-forming units of coagulase-negative staphylococcus resistant to tetracycline, ciprofloxacin, and levofloxacin. Sensitive to nitrofurantoin, ampicillin, linezolid, and tigecycline. Also Enterococcus faecalis 50,000 colony-forming units resistant to tetracycline, ciprofloxacin, and levofloxacin. Sensitive to nitrofurantoin, ampicillin, linezolid, and tigecycline.  It was not clear, however, if the patient had a urinary tract infection or those bacteria such as coagulase-negative Staphylococcus and Enterococcus faecalis are contaminants. However, because of her confusion the patient was initiated on nitrofurantoin.  With conservative therapy, Geodon, nitrofurantoin, and IV fluids, the patient's condition improved. She became less agitated. She was able to follow commands and was able to answer simple questions by the day of discharge. It was felt that the patient could have had delirium due to  some dehydration as well as possibly urinary tract infection; however, advanced dementia with behavioral problems and related agitation was not ruled out as a primary cause of her agitation. For this reason, the patient is to continue her current management with Geodon and new doses of trazodone. However, she is to continue also therapy with nitrofurantoin for eight more days to complete a ten-day course.  2. The patient was also noted to have oral candidiasis. She was started on nystatin swish and swallow. It is recommended to follow her very closely and ensure that her oral candidiasis is treated well.  3. In regards to her chronic medical problems such as liver cirrhosis, history of hep C, former alcohol abuse, coronary artery disease, seizure disorder, chronic obstructive pulmonary disease, and hyperlipidemia, the  patient is to continue her outpatient medications. No changes were made in those medications.  The patient, however, had a decreased dose of tramadol to 25 mg  twice daily dose. She needs to  be reassessed for pain control and maybe her pain medications can be completely discontinued if possible.   She is to follow up with her primary care physician as well as get physical therapy in the facility.       TIME SPENT: 40 minutes.    ____________________________ Katharina Caperima Shamere Dilworth, MD rv:[id number] D: 07/27/2011 12:39:13 ET T: 07/27/2011 13:18:38 ET JOB#: [id number]  cc: Katharina Caperima Darol Cush, MD, <Dictator> Kristopher OppenheimJane D. Shawn StallHollingsworth, MD Katharina CaperIMA Ojani Berenson MD ELECTRONICALLY SIGNED 08/03/2011 11:59

## 2014-10-07 NOTE — Consult Note (Signed)
Brief Consult Note: Diagnosis: Cognitive disorder NOS, R/O Alzhaimer's dementia with behavioral problems, UTI delirium resolving.   Patient was seen by consultant.   Consult note dictated.   Recommend further assessment or treatment.   Orders entered.   Comments: Amber [name] has a h/o dementia and remote h/o alcoholism. She has been deteriorating since December of 2012. She returns to the hospital for AMS. She was diagnosed with UTI today and improved rapidly after she was given antibiotic. No agitation noted in the past 24 hours..  MSE: Alert, oriented to person and place, sitting in a chair conversing with the family, answering my questions appropriately, asking for a baked potato. Ne agitation, no evidence of hallucinations or paranoia.   PLAN: 1. I will have haldol available for agitation prn. Please avoid benzodiazepines and/or anticholinergics as they worsen condition.   2. The family is interested in transfer to [place]. The facility accepts only patients who are able to participate in programming. In addition her insurere doe not have a contract with any geropsychiatry facilities.   3. I will not  start any new medications unless necessary. Less is more in delirium.   4. I met with the patient and two daughters.   5. I will follow up.  Electronic Signatures: Orson Slick (MD)  (Signed 05-Feb-13 19:37)  Authored: Brief Consult Note   Last Updated: 05-Feb-13 19:37 by Orson Slick (MD)

## 2014-10-07 NOTE — Consult Note (Signed)
PATIENT NAME:  Amber [name], Amber [name] MR#:  [id number] DATE OF BIRTH:  1945/09/10  DATE OF CONSULTATION:  07/08/2011  REFERRING PHYSICIAN:  Krystal Eaton, MD CONSULTING PHYSICIAN:  Doralee Albino. Maryruth Bun, MD  REASON FOR CONSULTATION: Altered mental status.   HISTORY OF PRESENT ILLNESS: Amber [name] is a 69 year old currently separated African American female with a history of polysubstance dependence as well as bipolar disorder as well as multiple medical problems including dementia, cirrhosis of the liver, and history of multiple strokes who was admitted to the Medicine service secondary to worsening confusion and mental status changes over the past four days. Psychiatry was consulted secondary to worsening confusion and altered mental status. The patient had also just recently been discharged from [facility] Medicine service on 06/24/2011 with acute renal failure and similar circumstances. After aggressive IV hydration, mental status returned to baseline. Today the patient is lying comfortably in her hospital bed. She is fairly calm at the present time, but has had mood lability and periods of agitation during the night. She did not sleep very well, per her daughter, who was at the bedside and sitting with her this afternoon. When asked about the date she said 05/14/1913. When given a choice of place between school, post office, and hospital the patient believed she was in a post office. The patient was not able to answer questions appropriately and thought processes were disorganized. Most of the history is taken per the patient's daughter. Per her daughter, she has become more paranoid over the past two weeks and since this past Saturday mental status has worsened. The patient has not been eating or drinking very well in the past 3 to 4 days either. She has been seeing snakes and squirrels at [employer] and she has been talking to her deceased brother and mother. Per the patient's daughter, she was  paranoid about staff at [employer] as well. Last night she became combative and a family member had to come sit with her. The patient seems to be calmer with family members in the room but agitated when a sitter is there. Per her daughter, mental status has worsened over the past six months and then she was placed in [facility] in November. Per prior records, she has struggled with depression in the past as well as had a history of suicide attempts. Per her daughter, she has not recently been endorsing any depressive symptoms. The patient herself denied feeling depressed and said she was "just fine". Speech was somewhat mumbled at times and difficult to understand. She did not have her dentures in. Per her daughter, she has not been sleeping well over the past 3 to 4 days and appetite has been poor. At the present time, she does not appear to be responding to internal stimuli, but her daughter reports that she has been psychotic in the past two days.   PAST PSYCHIATRIC HISTORY: Per prior records, the patient was hospitalized at [facility] in 2010 on the psychiatry service for depression. She does have a history of two suicide attempts in the past by overdose. The patient was hospitalized at [facility] for polysubstance dependence in the past as well. Per her daughter, she sees a Therapist, sports at [employer] and recently had an increase in her medication. Seroquel was increased from 50 mg to 75 mg at bedtime. She has had prior trials of Depakote, lithium, Zoloft, Paxil, and Prozac. She is on Lamictal 200 mg p.o. twice daily, but her daughter believes that may  be for seizures. It is unclear whether or not she truly has the diagnosis of bipolar disorder or mood disturbance may be secondary to dementia.   SUBSTANCE ABUSE HISTORY: There is a history of heavy alcohol, cocaine and cannabis dependence, as well as IV heroin use. She has been clean from drugs for the past 18 years, per her daughter. She was smoking one pack of cigarettes  per day up until Thanksgiving 2012.   FAMILY PSYCHIATRIC HISTORY: The patient has a history of having two maternal aunts with Alzheimer's dementia.   PAST MEDICAL HISTORY:  1. Hypertension.  2. Hyperlipidemia.  3. Progressive dementia, diagnosed in 2011, per her daughter. 4. Coronary artery disease status post myocardial infarction and stenting. 5. History of TIAs and cerebral vascular accident.  6. History of cirrhosis with alcoholism and hepatitis C virus. 7. Chronic obstructive pulmonary disease. 8. Seizure disorder. 9. Arthritis. 10. Gout. 11. History of common femoral artery nonfilling dissection. 12. History of left vertebral artery aneurysm dilation. 13. Right knee swelling and history of knee prosthesis. 14. History of left eye prosthesis.  15. History of chronic V-fib.  16. History of neuropathy.  ALLERGIES: Darvocet, penicillin, Tylenol, Celebrex.  OUTPATIENT MEDICATIONS:  1. Aspirin 81 mg p.o. daily. 2. Ativan 0.5 mg twice a day p.r.n. for agitation, although no Ativan given, [facility].  3. Coreg 25 mg p.o. twice a day. 4. Plavix 75 mg p.o. daily. 5. Colchicine 0.6 mg p.o. daily. 6. Fluticasone 15 mcg two sprays in each nostril daily.  7. Gabapentin 100 mg daily. 8. Lactulose 30 mL twice a day. 9. Lamictal 200 mg p.o. twice a day. 10. Protonix 40 mg daily. 11. Robitussin cough syrup p.r.n.  12. Seroquel 75 mg at bedtime.  13. Simvastatin 40 mg daily.  14. Spiriva 18 mcg one capsule inhaled daily. 15. Spironolactone 25 mg p.o. daily. 16. Tramadol 50 mg every 12 hours. 17. Ventolin inhaler p.r.n.   ALLERGIES: Celebrex, Darvocet, penicillin, Tylenol.   SOCIAL HISTORY: The [place] originally from the [place] area and raised by both her biological parents. She has an eleventh grade education and then went back and got her GED. She has been on disability for several years. The patient has been separated for over 20 years, but has two daughters and  one son. All three of her children are healthcare power of attorney and involved in her treatment. She has been residing at [facility] since November 2012.   Legal history: The patient has a history of forgery arrest 20 years ago.   MENTAL STATUS EXAM: Amber [name] is a 69 year old African American female who is lying comfortably in her hospital bed. She was calm at the present time, but per collateral information from her daughter and nursing notes she has had periods of agitation and mood lability. Speech was difficult to understand and mumbled at times. The patient did not have her dentures in place. Mood was described as being "just fine". Affect is calm at the present time. Thought processes were disorganized and the patient was not answering questions appropriately at times. She denied any current suicidal or homicidal thoughts. She denied any current auditory or visual hallucinations. She denied any paranoid thoughts or delusions. However, per nursing report, she has been quite paranoid and psychotic. Attention and concentration are poor. Judgment and insight are poor. Recall was 3 out of 3 initially and 0 out of 3 after five minutes. she was unable to name the current president. She could  do some simple calculations. The patient was unable to answer questions with regards to proverbs.   SUICIDE RISK ASSESSMENT: At this time, Amber [name] remains at a moderately elevated risk of harm to self and others secondary to psychosis and mental status changes.   REVIEW OF SYSTEMS: The patient was not able to answer questions appropriately.   VITAL SIGNS: Blood pressure 123/77, heart rate 81, respirations 18, temperature 97.9. Please see initial physical exam as completed by admitting physician, Dr. Krystal Eaton.   LABS/STUDIES: Sodium 144, creatinine 1.68, potassium 4.7, chloride 111, CO2 21, BUN 6, creatinine 1.68, glucose 92, ammonia level less than 25, magnesium 1.8, alkaline phosphatase 75, AST 25, ALT  24. Troponin less than 0.02. White blood cell count 5.7, hemoglobin 10.8, hematocrit 33.6, platelet count 187.   EKG showed a ventricular rate of 55 and a QTc interval of 445.   Urinalysis was nitrite and leukocyte esterase negative with no bacteria, less than 1 epithelial cell.  DIAGNOSIS:   AXIS I:   1. Delirium, multifactorial, may be secondary to acute renal failure, rule out stroke.  2. Dementia with behavioral disturbances, mood disorder not otherwise specified.  3. History of polysubstance dependence, in full remission.   AXIS II: Deferred.   AXIS III: Cirrhosis of the liver, hyperlipidemia, hypertension, dementia, history of multiple CVAs, chronic obstructive pulmonary disease, seizure disorder, arthritis, gout.  AXIS IV: Severe - inability to care for self independently.  AXIS V: GAF at present equals 20.   ASSESSMENT AND TREATMENT RECOMMENDATIONS: Amber [name] is a 69 year old African American female with history of multiple medical problems as well as dementia and a mood disorder with worsening psychotic symptoms admitted to the Medicine service with altered mental status and confusion. At this time, it is unclear what exactly may be contributing to mental status. During her prior hospitalization, the patient did have acute renal failure and presented in a similar fashion but after aggressive IV hydration mental status returned to baseline. Ammonia level is not elevated during this hospitalization and the patient does not have any evidence of any infectious causes contributing to mental status changes. Will check MRI Brain to r/o a new ischemic event. We will check a Lamictal level to rule out any toxicity from Lamictal. We will also plan to increase Seroquel to help with agitation. We will plan to increase Seroquel to 100 mg p.o. nightly and 25 mg p.o. every eight hours p.r.n. for paranoid thoughts. If no significant improvement with Seroquel in the next 1 to 2 days, then we will  switch to trying Zyprexa or Geodon. The patient herself is denying any significant depressive symptoms or suicidal thoughts. We will keep with a one-to-one sitter at this time for safety due to active psychotic symptoms. We will continue to follow the patient on a daily basis. Medication changes were reviewed with the patient's daughter and healthcare power of attorney at the bedside who was supportive and in agreement with medication changes.   TIME SPENT: 75 minutes with > 50% time spent in coordination of care ____________________________ Doralee Albino. Maryruth Bun, MD akk:slb D: 07/08/2011 13:41:21 ET T: 07/08/2011 15:22:47 ET JOB#: [id number]  cc: Aarti K. Maryruth Bun, MD, <Dictator> Darliss Ridgel MD ELECTRONICALLY SIGNED 07/09/2011 17:50

## 2014-10-07 NOTE — Discharge Summary (Signed)
PATIENT NAME:  Amber [name], Amber [name] MR#:  [id number] DATE OF BIRTH:  1946-05-15  DATE OF ADMISSION:  07/06/2011 DATE OF DISCHARGE:  07/10/2011   CHIEF COMPLAINT: Poor p.o. intake, altered mental status, confusion.   CONSULTANTS:  1. Physical therapy  2. Dr. Maryruth Bun from Psychiatry  3. Dr. Harriett Sine Phifer from Palliative Care    HISTORY OF PRESENT ILLNESS: Please see the full history and physical dictated on 07/06/2011 by Dr. Jacques Navy. Briefly, this is a 69 year old African American female with history of polysubstance abuse, cirrhosis, HCV, history of TIAs and CVAs who came in with poor p.o. intake, altered mental status, and confusion. She was found with acute renal failure and admitted to the hospitalist service for further evaluation.   DISCHARGE DIAGNOSES:  1. Altered mental status likely from dehydration, acute renal failure, dementia, and mood disorder.  2. Acute renal failure.  3. Hyperkalemia.  4. History of chronic liver disease.  5. History of hypertension.  6. History of liver cirrhosis.  7. History of dementia.  8. History of transient ischemic attack and cerebrovascular accident.  9. History of seizure disorder.  10. History of coronary artery disease.   DISCHARGE MEDICATIONS:  1. Gabapentin 100 mg daily.  2. Plavix 75 mg daily.  3. Aspirin 81 mg daily.  4. Fluticasone 50 mcg inhaled two sprays in each nostril daily. 5. Spiriva 18 mcg one cap daily inhaled.  6. Lactulose 30 mL 2 times a day.  7. Ventolin HFA 90 mcg 2 puffs three times a day.  8. Nitrostat 0.4 mg every five minutes as needed for chest pain.  9. Coreg 25 mg b.i.d.  10. Simvastatin 40 mg daily.  11. Spironolactone 25 mg daily.  12. Colchicine 0.6 mg daily.  13. Lamotrigine 200 mg 2 times a day.  14. Protonix 40 mg daily.  15. Tramadol 50 mg every 12 hours.  16. Robitussin 5 mL every four hours as needed for cough.  17. Cogentin 0.5 mg p.o. b.i.d.  18. Olanzapine 5 mg at bedtime.  19. Olanzapine 2.5 mg  b.i.d. and also every eight hours as needed for acute psychosis.  20. Haldol 0.5 mg every 12 hours as needed for psychosis.   DIET: Low sodium, ADA diet.   ACTIVITY: As tolerated.   FOLLOW-UP: Please follow-up with your psychiatrist in 1 to 2 weeks.   SIGNIFICANT LABS AND IMAGING: Initial creatinine 1.96, on discharge 1.19. Initial BUN 28, on discharge 12. Troponin negative x1. Potassium peak was 5.6, on discharge 5. Initial WBC 6.8, on discharge 5.7. Urinalysis essentially negative for infection. Lamictal level within therapeutic level. Shoulder, left, x-ray complete no evidence of acute abnormalities. Chest, one view, x-ray showing atelectasis versus infiltrate versus confluence of density just lateral to the right hilar region and cardiomegaly. MRI of the brain on January 24th showing periventricular changes noted consistent with chronic ischemia.   HOSPITAL COURSE: The patient was admitted to the hospitalist service and started on IV fluids. The patient had a similar presentation and discharge on January 9th. After IV fluid resuscitation and resolution of acute renal failure, the patient's confusion and mental status improved. The patient had a negative MRI of the brain for CVA. Furthermore, the patient had negative troponin on arrival and had no fevers or significant leukocytosis to suggest she was having infection. As the GFR was getting corrected, the patient continued to have confusion, paranoia, and mood disorder. Psychiatry was consulted and the patient's medications were adjusted. It appears that at least some of her  confusion and mental status changes are psychiatric in nature. Of note, the patient's Seroquel which was recently uptitrated as an outpatient was discontinued and the patient was started on Cogentin, olanzapine, and low dose Haldol. She was seen also by Palliative Care and initially the patient's family had requested screen for Hospice. However, she is not a Hospice candidate.    In regards to renal failure, that has continued to improve with IV fluids. Of note, the patient did have some urinary retention and needed a Foley insertion. Initially her spironolactone and colchicine were held, however, they can be resumed on an outpatient basis. The Foley catheter could be weaned off as an outpatient and if that is not possible maybe Urology could be consulted as an outpatient.   The patient's other outpatient medications including aspirin, Plavix, and statin were continued for the CAD as well as TIA history. The patient also had a Lamictal level which was within therapeutic range and thus was continued.   The patient did have mild hyperkalemia which also did correct.   At this point, she will be discharged to SNF for further physical therapy. The patient is unable to go back to her assisted living facility and needs skilled nursing at this point.   TOTAL TIME SPENT: 35 minutes.   CODE STATUS: The patient is FULL CODE.  ____________________________ Krystal EatonShayiq Reo Portela, MD sa:drc D: 07/10/2011 14:33:13 ET T: 07/10/2011 14:46:17 ET JOB#: [id number]  cc: Krystal EatonShayiq Taysom Glymph, MD, <Dictator> Krystal EatonSHAYIQ Yvette Roark MD ELECTRONICALLY SIGNED 07/14/2011 18:41

## 2014-10-07 NOTE — Consult Note (Signed)
Brief Consult Note: Diagnosis: Cognitive disorder NOS, R/O Alzheimer's dementia with behavioral problems, UTI delirium resolving.   Patient was seen by consultant.   Consult note dictated.   Recommend further assessment or treatment.   Orders entered.   Discussed with Attending MD.   Comments: [name] has a h/o dementia and remote h/o alcoholism. She has been deteriorating since December of 2012. She returns to the hospital for AMS. I was called last night to address agitated behavior. She received several injections of Geodon. We started Geodon 40 mg twice daily and Trazodone 50 mg three times daily for agitation. She is calmer and more cooperative today. She is asking to go home. Given her history of poor behavior, the family is seeking admission to Geropsychiatry. Their insurere doe not cover any gero programs. [facility] but the family declined.   MSE: Alert, oriented to person and place, sitting in bed, wearing a hat, conversational, pleasant, cool and collected. No agitation, no evidence of hallucinations or paranoia.   PLAN: 1. Agitation-We will continue oral Geodon and Trazodone. We will use im geodon for agitation prn.   2. Dementia-Please avoid benzodiazepines and/or anticholinergics as they worsen condition.   3. The family is interested in transfer only to [place]. Cere manager involved.    4. I will follow up.  Electronic Signatures: Amber [name], Amber [name] (MD)  (Signed 07-Feb-13 17:21)  Authored: Brief Consult Note   Last Updated: 07-Feb-13 17:21 by Amber [name], Amber [name] (MD)

## 2014-10-07 NOTE — H&P (Signed)
PATIENT NAME:  Amber [name], RUNKLES MR#:  [id number] DATE OF BIRTH:  [birth year]  DATE OF ADMISSION:  07/06/2011  REFERRING PHYSICIAN: Dr. Shaune Pollack  PRIMARY CARE PHYSICIAN: Dr. Shawn Stall  CHIEF COMPLAINT: Poor p.o. intake, altered mental status, confusion.   HISTORY OF PRESENT ILLNESS: Patient is a 69 year old African American female with multiple medical problems including progressive dementia, history of polysubstance abuse, cirrhosis, HCV and history of strokes who presents from [facility] assisted [facility] facility for the above symptoms. Patient is not a good historian, however, is accompanied by her daughter, Vertis Kelch. Apparently she has had poor p.o. intake since last discharge from [facility] on 01/09. Of note, patient was recently admitted and discharged from 01/04 to 01/09. At that time patient had very similar presentation and symptoms and had acute renal failure with dehydration, poor p.o. intake and altered mental status which improved with hydration per the discharge summary. She was transferred to [facility] assisted living facility and she was referred here again for similar symptoms. She is not providing much history and what I could derive was from records from the ER physician as well as family. 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 She was also told that her mother has had progressive worsening dementia recently. I also spoke with Dr. Shawn Stall, patient's primary care physician. She had sent the patient to a psychiatrist more recently as well and there have been some adjustments to her psych medications namely increasing the Seroquel from 50 to 75 mg as well as decreasing the gabapentin to 100 mg from 300 mg. Her "altered mental status" has been more of a behavioral issue per Dr. Shawn Stall. Here she was again found with acute renal failure with increase in  BUN and creatinine, creatinine currently being 1.96, however, her ammonia level has been less than 25 here. She had urinalysis which is not suggestive of a urinary tract infection. Hospitalist services were contacted for evaluation and admission for observation.   PAST MEDICAL HISTORY:  1. Hypertension. 2. Hyperlipidemia. 3. Progressive dementia diagnosed last year per daughter. 4. Coronary artery disease status post myocardial infarction and stenting. 5. History of transient ischemic attack and cerebrovascular accident. 6. History of cirrhosis with alcoholism and hepatitis C virus. 7. Former alcohol abuse.  8. Tobacco abuse. 9. Depression with history of suicidal ideation. 10. Chronic obstructive pulmonary disease. 11. Seizure disorder. 12. Arthritis. 13. Gout. 14. 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 15. Polysubstance abuse; previous opioid dependence, cocaine abuse and tobacco abuse. 16. History of left vertebral artery aneurysmal dilation incidentally found prior. At that time patient was seen by vascular surgery and underwent CT angiogram which did not reveal any significant aneurysm.  17. Right knee swelling and history of knee prosthesis. 18. History of left eye prosthesis.  19. History of chronic debilitation. 20. History of neuropathy.   ALLERGIES: Darvocet, penicillin, Tylenol, Celebrex.  CURRENT MEDICATIONS:  1. Aspirin 81 mg daily.  2. Ativan 0.5 mg two times daily p.r.n. for agitation.  3. Coreg 25 mg 2 times a day.  4. Plavix 75 mg daily.  5. Colchicine 0.6 mg daily.  6. Fluticasone 15 mcg, 2 sprays in each nostril daily.  7. Gabapentin 100 mg daily.  8. Lactulose 30 mL b.i.d.  9. Lamotrigine 200 mg 2 times a day.  10. Nitrostat 0.4 sublingual every five minutes as needed for chest pain.  11. Protonix 40 mg daily.  12. Robitussin cough syrup p.r.n.  13. Seroquel 75 mg daily.  14. Simvastatin 40 mg daily.  15. Spiriva 18 mcg  one cap inhaled daily.  16. Spironolactone 25 mg daily.  17. Tramadol 50 mg every 12 hours. 18. Ventolin HFA p.r.n.   FAMILY HISTORY: Hypertension.   SOCIAL HISTORY: Per daughter she has quit smoking and does not drink alcohol. No other drug use. Lives at [facility].   REVIEW OF SYSTEMS: Unable to fully obtain from the patient or family, however, there have been no fevers. There have been some falls recently and confusion. Otherwise, the patient did not allow for questioning further to obtain a full review of systems.   PHYSICAL EXAMINATION:  VITAL SIGNS: Temperature 97, pulse rate 66, respiratory rate 18, blood pressure 165/84, oxygen saturation 94%.  CARDIOVASCULAR: This physical exam was very limited by the patient's not allowing me to examine her, however, after further conversations with her as well as her daughter and son-in-law I was allowed to listen to her heart and lungs. She has no murmur. Regular rate and rhythm. No gallops.   LUNGS: Anteriorly only clear to auscultation.   EXTREMITIES: Does not look to have any pitting edema.  HEENT: Lips appear to be very dry as well as mucous membranes.  SKIN: Poor skin turgor. Otherwise I was not allowed to examine her further, however, on cursory look patient's face appears to be symmetrical.   NEUROLOGICAL: Patient moves all her extremities spontaneously.   LABORATORY, DIAGNOSTIC AND RADIOLOGICAL DATA: Creatinine 1.96, it was 1.35 on 01/16. Chloride 109, sodium 142, potassium 4.8, glucose 81, ammonia level is less than 25, troponin is negative x1. WBC 6.8, hemoglobin 12.2, hematocrit 38.4, platelets 197. Urinalysis negative for nitrates or leukocyte esterase, 1 WBC, no bacteria. Chest PA one view showing atelectasis versus infiltrate versus confluence of densities just lateral to the right hilar region, cardiomegaly. Shoulder left complete: No evidence of acute abnormalities.   ASSESSMENT AND PLAN: We have a  69 year old African American female with multiple comorbidities including progressive dementia, history of encephalopathy, transient ischemic attacks and CVAs, hypertension, polysubstance abuse, gout who was recently here for altered mental status during which she was found to have clinical dehydration, acute renal insufficiency and poor p.o. intake with normal ammonia levels presents with similar symptoms. At this point, we would admit the patient for observation and start her on gentle IV fluids and hold her spironolactone and colchicine. I discussed the case with Dr. Shawn StallHollingsworth, patient's primary care physician, as well. Patient was recently seen by psychiatry as well as neurology. Per daughter the neurologist thought her dementia is progressive and she has had transient ischemic attacks and cerebrovascular accidents in the past, this is known already and psych has been adjusting some of her medications as an outpatient including decreasing the gabapentin, increasing the Seroquel. At this point we would admit the patient for IV fluids and recheck the GFR in the morning. She has had bowel movements and her ammonia level is within normal limits. I would not order repeat CT of the brain as she has had several recently all without any acute evidence for CVA. She does appear to have acute renal failure and this likely is prerenal due to poor p.o. intake. I would start her on gentle IV fluids. For her history of transient ischemic attacks and cerebrovascular accidents I would continue the aspirin, Plavix and statin. For history of coronary artery disease and myocardial  infarctions I would also continue the beta blocker including aspirin, statin and Plavix. Per Dr. Shawn Stall her Lamictal level as an outpatient was recently checked and it was normal therefore I would just continue the Lamictal at current dose. Patient has been seen by care management and the plan is for possible transfer to skilled nursing  facility during this hospitalization.   CODE STATUS: FULL CODE.   TOTAL TIME SPENT: 55 minutes.   ____________________________ Krystal Eaton, MD sa:cms D: 07/06/2011 18:00:11 ET T: 07/07/2011 05:50:49 ET JOB#: [id number]  cc: Krystal Eaton, MD, <Dictator> Kristopher Oppenheim. Shawn Stall, MD Krystal Eaton MD ELECTRONICALLY SIGNED 07/14/2011 18:41

## 2014-10-07 NOTE — Discharge Summary (Signed)
PATIENT NAME:  Amber [name], Amber [name] MR#:  [id number] DATE OF BIRTH:  07-10-1945  DATE OF ADMISSION:  06/19/2011 DATE OF DISCHARGE:  06/22/2011  Please take a look at the detailed note from the history and physical done by Dr. Camillo FlamingKamran Lateef.   DIAGNOSES AT DISCHARGE:  1. Altered mental status secondary to dehydration, acute renal failure.  2. Acute renal failure.  3. History of chronic liver disease. 4. Hypertension. 5. History of liver cirrhosis. 6. Dementia. 7. History of previous transient ischemic attack. 8. Coronary artery disease.   DIET: Patient is being discharged on a low-sodium, low-fat mechanical soft diet.   ACTIVITY: As tolerated.   DISPOSITION: Patient is being discharged to a skilled nursing facility at [facility]. Follow up with primary care physician there.   DISCHARGE MEDICATIONS:  1. Seroquel 50 mg at bedtime.  2. Simvastatin 40 mg at bedtime. 3. Gabapentin 300 mg daily.  4. Aldactone 25 mg daily.  5. Colchicine 0.6 mg daily.  6. Lamictal 200 mg b.i.d.  7. Protonix 40 mg daily.  8. Plavix 75 mg daily.  9. Aspirin 81 mg daily.  10. Flonase two sprays to each nostril daily.  11. Spiriva 1 puff daily.  12. Lactulose 30 mL b.i.d.  13. Ventolin inhaler 2 puffs t.i.d. as needed. 14. Sublingual nitroglycerin as needed.  15. Tramadol 50 mg 1/2 tab q.12 hours as needed.  16. Nicotine patch. 17. Robitussin 5 mL every four hours p.r.n.  18. Coreg 25 mg b.i.d.   LABORATORY, DIAGNOSTIC AND RADIOLOGICAL DATA: Pertinent studies done during hospital course are as follows: CT scan of the head done on admission showing no acute intracranial process. Chest x-ray done on admission showing borderline cardiomegaly but no acute cardiopulmonary disease. Also urinalysis on admission showing negative nitrites, negative leukocyte esterase, no evidence of urinary tract infection.   HOSPITAL COURSE: This is a 69 year old female with medical problems as mentioned above presented to  the hospital due to altered mental status and dehydration.  1. Altered mental status. Patient presented to the hospital with confusion, agitation. She does have a history of chronic liver disease therefore initially was suspected due to possible encephalopathy although her ammonia level was less than 25. She underwent a CT of the head which was essentially negative. She had no other further neurological signs of altered mental status. She was noted to be dehydrated on admission therefore started on IV fluids. After getting aggressive IV fluids and her renal function improving her mental status is now back to baseline.  2. Acute renal failure. Patient presented to the hospital with a BUN of 59, creatinine of 2.6, which is worse from her baseline. This was thought to be dehydration and poor p.o. intake. She was aggressively hydrated with IV fluids. Her renal function has now come back down to baseline. Her mental status is now back down to baseline too. Her Aldactone was initially held although it can be resumed upon discharge today. It is advisable that if the patient has poor p.o. intake that she should probably not take her Aldactone during those days to prevent any further dehydration issues.  3. Chronic liver disease. Patient has liver cirrhosis secondary to alcohol abuse. She did not have any evidence of hepatic encephalopathy, GI bleeding or any ascites. There is no evidence of any sequelae related to her chronic liver disease presently.  4. History of previous transient ischemic attack and cerebrovascular accident. Patient was maintained on her aspirin and Plavix, she will resume that.  5.  History of coronary artery disease. There was no evidence of any chest pain. She had cardiac enzymes x3 which were negative. She will continue aspirin, Plavix, beta blockers and statin as mentioned.  6. Chronic obstructive pulmonary disease. There was no evidence of any acute exacerbation. She will resume her Spiriva,  her Ventolin inhaler and her Flonase.  7. History of gout. There was no evidence of acute gout attack. She will resume her colchicine as stated.  8. CODE STATUS: Patient is a FULL CODE. She is being discharged to a skilled nursing facility for further physical therapy.   TIME SPENT WITH DISCHARGE: 40 minutes.  ____________________________ Rolly Pancake. Cherlynn Kaiser, MD vjs:cms D: 06/22/2011 13:29:40 ET T: 06/22/2011 13:42:29 ET JOB#: [id number]  cc: Rolly Pancake. Cherlynn Kaiser, MD, <Dictator> Houston Siren MD ELECTRONICALLY SIGNED 06/22/2011 16:57

## 2014-10-07 NOTE — H&P (Signed)
PATIENT NAME:  Amber [name], Amber [name] MR#:  [id number] DATE OF BIRTH:  02-19-1946  DATE OF ADMISSION:  06/19/2011  ADDENDUM:  ASSESSMENT AND PLAN:  1. Hypertension. Continue Coreg and monitor blood pressure closely. 2. Hyperlipidemia. Continue Zocor but only if transaminases are normal.  3. Cirrhosis. Await LFTs.  4. Currently she appears to be encephalopathic and await ammonia level and will also to continue her lactulose and titrate to 3 to 4 soft bowel movements per day. She does not appear to be volume overloaded at this time, therefore, hold diuretic therapy and she is actually volume deplete and dehydrated and will be hydrated cautiously with IV fluids. No evidence of ascites on physical examination, therefore, doubt SBP. She is currently afebrile without leukocytosis. There is no tenderness to palpation over her anterior abdominal wall. She did not wince with deep or gentle palpation.  5. Further work-up and management to follow depending on the patient's clinical course.   ____________________________ Elon AlasKamran N. Yalitza Teed, MD knl:drc D: 06/19/2011 17:27:07 ET T: 06/19/2011 17:45:14 ET JOB#: [id number]  cc: Elon AlasKamran N. Ancil Dewan, MD, <Dictator> Kristopher OppenheimJane D. Shawn StallHollingsworth, MD [facility]  Danetta Prom Dorris CarnesN Maimuna Leaman MD ELECTRONICALLY SIGNED 07/02/2011 18:56

## 2014-10-07 NOTE — H&P (Signed)
PATIENT NAME:  Amber [name], [name] MR#:  [id number] DATE OF BIRTH:  03-15-1946  DATE OF ADMISSION:  07/19/2011  REFERRING PHYSICIAN: Dr. Darnelle CatalanMalinda  PRIMARY CARE PHYSICIAN: Dr. Shawn StallHollingsworth  PRESENTING COMPLAINT: Altered mental status, increased agitation, poor p.o. intake.   HISTORY OF PRESENT ILLNESS: [name] is an unfortunate 69 year old woman with history of cirrhosis, hepatitis C, former alcohol abuse, history of transient ischemic attack, CVA, hypertension, chronic obstructive pulmonary disease, seizure disorder, bipolar disorder who [place] [facility] [place] [facility]. Her two daughters who have POA are present in the room. Patient was brought in on 07/17/2011 via her PCP. According to her daughters on approximately 07/15/2011 patient had increased agitation, had been refusing to take medications and decreased p.o. intake. She has been having waxing and waning episodes and having some hallucinations. Her primary physician saw her on 07/17/2011 with concerns that she may have some medication side effects and adjusting her medications were difficult in an outpatient setting due to the side effects. She spoke with Dr. Maryruth BunKapur who recommended that patient be brought in to the ED and have Dr. Guss Bundehalla admit either to [facility] or [facility] once patient was medically cleared in the Emergency Room. Patient and family have been awaiting in the Emergency Department and when [facility] case felt that she was too medically complex to be admitted therefore patient will be admitted to medicine floor for medication adjustments with regards to her psychotropic medications.   PAST MEDICAL HISTORY:  1. Last admitted 01/21 to 07/10/2011 with similar issue and thought to be in the setting of dehydration, acute renal failure, progressive dementia and mood disorder.  2. Vascular dementia with progression. It seems that patient has accelerated decline since December 2012. Prior to that she was in  assisted living facility and doing her own activities of daily living and was getting meals prepared for her and was ambulating with a walker. She is now wheelchair bound and in chronic debilitation.  3. Hypertension.  4. Hyperlipidemia.  5. Bipolar disorder.  6. Vascular/progressive dementia.  7. Coronary artery disease status post myocardial infarction and stenting.  8. Valvular heart disease. Echo from June 2012 with normal ejection fraction, moderate left ventricular hypertrophy, moderate AI, mild to moderate MR and TR.  9. Transient ischemic attack and cerebrovascular accident.  10. Cirrhosis with history of alcohol abuse and hepatitis C.  11. History of bipolar disorder with history of suicidal ideation.  12. Chronic obstructive pulmonary disease.  13. Seizure disorder.  14. Arthritis.  15. Gout.  16. 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  17. Polysubstance abuse with opioid dependence, cocaine abuse and tobacco abuse.  18. History of left vertebral artery aneurysm dilation which via CT angiogram was not significant.  19. Chronic debilitation, currently at skilled nursing facility, followed by [facility] program.  20. Neuropathy.   PAST SURGICAL HISTORY:  1. Left shoulder surgery.  2. Right knee replacement.  3. Left eye surgery for retinal detachment, now with prosthesis.  4. Right cataract surgery.  5. Hysterectomy.   ALLERGIES: Darvocet, penicillin, Tylenol, Celebrex.   MEDICATIONS:  1. Coreg 25 mg b.i.d.  2. Benztropine 0.5 mg b.i.d.  3. Lactulose 10 grams/15 mL, 30 mL b.i.d.  4. Lamotrigine 200 mg b.i.d.  5. Ultram 25 mg b.i.d.  6. Protonix 40 mg daily.  7. Fluticasone 50 mcg two sprays each nares daily.  8. Spiriva daily.  9. Plavix 75 mg daily.  10. Aspirin 81 mg daily.  11. Zyprexa  2.5 mg daily.  12. Simvastatin 40 mg daily.  13. Olanzapine 5 mg at bedtime.  14. Ventolin HFA 90 mcg inhaler 2 puffs t.i.d.  15. Guaifenesin 100  mg/5 mL, 5 mL every four hours as needed.  16. Nitrostat sublingual 0.4 mg every 5 minutes, up to three doses.  17. Trazodone 50 mg at bedtime as needed.   FAMILY HISTORY: High blood pressure and heart disease. Grandmother died of myocardial infarction. Mother died of myocardial infarction.    SOCIAL HISTORY: Quit tobacco in November 2012. Quit alcohol greater than 20 years ago. She has prior drug abuse with cocaine and opioid dependence. Her daughters are present with her and they have power of attorney. She currently resides at [facility] skilled nursing facility and now is wheelchair bound.   REVIEW OF SYSTEMS: Unable to obtain. Please see history of present illness due to her dementia and altered mental status.   PHYSICAL EXAMINATION:  VITAL SIGNS: Temperature 98, pulse 76, respiratory rate 18, blood pressure 124/69, sating at 93% on room air.    GENERAL: Lying in bed in no apparent atraumatic.   HEENT: Normocephalic, atraumatic. She has left eye prosthesis. Right eye nonicteric. Pupils responsive. Nares without discharge. Moist mucous membrane.   NECK: Soft and supple. No adenopathy or JVP.   CARDIOVASCULAR: Non-tachy. No murmurs, rubs, or gallops.   LUNGS: Faint basilar crackles. No use of accessory muscles or increased respiratory effort.   ABDOMEN: Soft, nontender, positive bowel sounds.   EXTREMITIES: No edema. Dorsal pedis pulses intact.   MUSCULOSKELETAL: No joint effusion.   SKIN: No rash.   NEUROLOGIC: Patient with limited cooperation but she does have symmetrical strength of upper and lower extremities and following commands minimally.   PSYCH: She is alert but not oriented.   LABORATORY, DIAGNOSTIC AND RADIOLOGICAL DATA: Glucose 99, BUN 23, creatinine 1.29, sodium 141, potassium 4.1, chloride 111, carbon dioxide 21, calcium 9, anion gap 9. CT head without contrast shows no acute findings. Chest x-ray with no acute findings. Urine drug screen negative. Urinalysis  with specific gravity 1.015, pH 6, RBC 1 per high-power field, WBC 7 per high-power field, trace leukocyte esterase. Acetaminophen level less than 2. Initial arrival creatinine 1.49, BUN of 35. Alcohol level less than 3. Salicylate level less than 1.7. TSH 2.95.   ASSESSMENT AND PLAN: Ms. Manger is a 69 year old woman with history of dementia, hypertension, hyperlipidemia, coronary artery disease status post myocardial infarction and stent, transient ischemic attack, CVA, cirrhosis, hep C, seizure disorder, prior polysubstance abuse presenting with altered mental status, increased agitation, poor p.o. intake.  1. Altered mental status. Encephalopathy possibly in the setting of progressive dementia and psychotropic medications. It seems patient has had an accelerated decline since December 2012. Previous work-up for stroke event has been negative. A repeat CT head this time is still negative. Patient was awaiting acceptance at [facility] but has been declined due to her complex medical issues. Will admit to the medical floor and consult psych as they have declined admission for medication adjustments and placement. Will continue to hold medications until orders are placed by psych. Will get prolactin level. Questionable seizure activity contributing to her altered mental status. Will get EEG. Get ammonia level. TSH is within normal limits. No source of infection. Will send folate, B12, magnesium level RPR. Urine drug screen is negative. She was initially dehydrated with poor p.o. intake. Will continue maintenance fluids at minimal level and creatinine is improved status post IV fluids. Continue neuro checks  and sitter. Will get care management consultation.  2. Cirrhosis with history of hep C and alcohol abuse. As above send ammonia level. Her LFTs are within normal limits and PT/INR. Continue lactulose.  3. Coronary artery disease status post myocardial infarction and stenting. Restart aspirin, Coreg, Plavix,  nitroglycerin sublingual as needed, simvastatin. Will place on off unit tele and cycle cardiac enzymes.  4. Prophylaxis with Plavix, aspirin, Lovenox and Protonix.      TIME SPENT: Approximately 50 minutes spent on patient care.   ____________________________ Reuel Derby, MD ap:cms D: 07/19/2011 23:35:46 ET T: 07/20/2011 08:46:55 ET JOB#: [id number]  cc: Pearlean Brownie Essie Lagunes, MD, <Dictator> Kristopher Oppenheim. Shawn Stall, MD Reuel Derby MD ELECTRONICALLY SIGNED 08/11/2011 0:38

## 2014-10-07 NOTE — Consult Note (Signed)
PATIENT NAME:  Amber [name], Amber [name] MR#:  [id number] DATE OF BIRTH:  11-Jan-1946  DATE OF CONSULTATION:  07/20/2011  REFERRING PHYSICIAN: Alounthith Phichith, MD   CONSULTING PHYSICIAN:  Chrystal Zeimet B. Korie Streat, MD  REASON FOR CONSULTATION: To evaluate a patient with altered mental status.   IDENTIFYING DATA: Amber [name] is a 69 year old female with altered mental status and agitation.   CHIEF COMPLAINT: The patient is unable to state.   HISTORY OF PRESENT ILLNESS: Amber [name] was hospitalized at [facility] for a similar presentation until January 25th. Per family's report, she was doing very well until December reportedly walking with a walker, feeding herself, and taking showers on her own. Since discharge from [facility] in January, the patient has been increasingly disabled. She does not get out of bed, refuses medications, eats poorly, and has periods of severe agitation. The family also feels that her speech became slurred and there is weakness on the right side of both arm and leg. Reportedly, her primary care provider at [facility], Dr. Shawn Stall, contacted Dr. Maryruth Bun who consulted the patient in January requesting admission to the Psychiatric Unit. She was advised to direct the patient to the Emergency Room for evaluation. The patient ended up being admitted to Medicine. Reportedly, [facility] was [facility] over the weekend but was rejected by them. The patient is unable to participate in psychiatric interview at present.   PAST PSYCHIATRIC HISTORY: She was hospitalized in 2010 at [facility] for depression. There is a history of two suicide attempts by overdose. She was also hospitalized at [facility] for polysubstance dependence in the past. She reportedly has been clean of alcohol or drugs for a while. The patient has had multiple medication trials including Depakote, lithium, Zoloft, Paxil, Prozac, and  Lamictal. She was tried on Zyprexa, Risperdal, and Seroquel in the hospital. She did not respond to Haldol. There is a history of heavy alcohol, cocaine, cannabis, as well as IV heroin abuse more than 20 years ago.   FAMILY PSYCHIATRIC HISTORY: There are two maternal aunts with Alzheimer's.   PAST MEDICAL HISTORY:  1. Hypertension.  2. Dyslipidemia.  3. Coronary artery disease, status post infarction and stenting. 4. History of transient ischemic attacks. 5. Cerebrovascular accident.  6. Alcoholic liver disease. 7. Hepatitis C. 8. Chronic obstructive pulmonary disease. 9. Seizure disorder. 10. Arthritis.  11. Gout.   ALLERGIES: Celebrex, Darvocet, penicillin, Tylenol.   MEDICATIONS ON ADMISSION:  1. Coreg 25 mg twice daily.  2. Cogentin 0.5 mg twice daily.  3. Lactulose 30 mL twice daily.  4. Lamictal 200 mg twice daily. 5. Ultram 25 mg twice daily.  6. Protonix 40 mg daily.  7. Fluticasone 50 mcg daily. 8. Plavix 75 mg daily.  9. Aspirin 81 mg daily.  10. Zyprexa 2.5 mg in the morning, 5 at bedtime.   11. Simvastatin 40 mg at night. 12. Ventolin as needed. 13. Nitrostat sublingual as needed. 14. Trazodone 50 mg as needed.   SOCIAL HISTORY: She is a resident of [facility]. She has a very supportive family. Her daughters have healthcare power-of-attorney. The family would like for the patient to be transferred to Geropsychiatry Unit if possible.   REVIEW OF SYSTEMS: I was unable to obtain. The patient did tell me that she has leg pain. She was unable to participate in PT today, unable to follow instructions. She was able to move both of her extremities and in spite of family  believing that there was weakness on the right side she was moving her right hand as if putting food into her mouth.    PHYSICAL EXAMINATION:   VITAL SIGNS: Blood pressure 142/87, pulse 74, respirations 18, temperature 98.2.   GENERAL: This is an obese confused female in no acute distress. The rest  of the physical examination is deferred to her primary attending.   LABORATORY DATA: Chemistries within normal limits with BUN 21. Blood alcohol level 0. LFTs within normal limits. Cardiac enzymes negative x3. TSH 2.95. Urine tox screen negative for substances. CBC within normal limits. Urinalysis not suggestive of urinary tract infection with leukocyte esterase and 7 white blood cells. Serum acetaminophen and salicylates are low.   MENTAL STATUS EXAMINATION: The patient is alert. She is in the room with the sitter initially, then upon my return in the evening with her family. On the first contact she greets me nicely. She is unable to answer any of my questions except to indicate that her legs are hurting. As I say my goodbyes, she very appropriately says "goodbye, see you later". She had slurred speech. During the interview she constantly moved her hands as if picking stuff from her covers and puts stuff in her mouth as if eating. She maintains some eye contact. Her speech is very slurred. Mood is impossible to assess. She appears to attend to internal stimuli, visual hallucinations. It is possible that she also has auditory hallucinations. Earlier on per sitter she believed that her daughter was in the room talking to her. Her cognition is impaired. Her insight and judgment are poor.   SUICIDE RISK ASSESSMENT: This is a patient with history of depression, alcoholic liver disease, and dementia who is unable to plan or execute a suicide attempt. She needs support and supervision.   DIAGNOSES:  AXIS I:  1. Dementia of Alzheimer's type with behavioral disturbances.  2. Rule out delirium, unknown cause. 3. History of polysubstance dependence in full remission. 4. History of depression.   AXIS II: Deferred.   AXIS III:  1. Alcoholic liver disease. 2. Hepatitis C.  3. Dyslipidemia.  4. Hypertension.  5. Coronary artery disease.   6. History of multiple CVAs. 7. Chronic obstructive pulmonary  disease. 8. Seizure disorder. 9. Arthritis.  10. Gout.   AXIS IV: Chronic. Progressive physical and mental illness, inability to care for herself.   AXIS V: GAF 20.   PLAN:  1. I did not observe any agitation. I saw the patient twice. Initially she was pleasantly confused and hallucinating. Later on she was asleep surrounded by her family. There are reports of sundowning and severe agitation. I will offer Haldol 2 mg as needed for agitation. Will prescribe liquid.  2. The family is very much interested in transferring the [facility]. We will attempt to negotiate with [place] in the morning.  3. I will follow-up.   ____________________________ Ellin GoodieJolanta B. Jennet MaduroPucilowska, MD jbp:drc D: 07/20/2011 21:37:00 ET T: 07/21/2011 09:38:58 ET JOB#: [id number]  cc: Darwin Guastella B. Jennet MaduroPucilowska, MD, <Dictator> Shari ProwsJOLANTA B Jauan Wohl MD ELECTRONICALLY SIGNED 07/31/2011 15:25

## 2014-10-07 NOTE — Consult Note (Signed)
Brief Consult Note: Diagnosis: Cognitive disorder NOS, Delirium of unknown cause, h/o alcohol dependence in remission.   Patient was seen by consultant.   Consult note dictated.   Recommend further assessment or treatment.   Orders entered.   Discussed with Attending MD.   Comments: [name] has a h/o dementia and remote h/o alcoholism. She has been deteriorating since December of 2012. She returns to the hospital for AMS. The family noticed right sided weakness and speech problems. There is occasional agitation.  She was pleasantly confused on exam, with visual hallucinations, picking objects that hse puts in her mouth with the right hand. Later, when I saw her with family she was quietly asleep.  PLAN: 1. I will start Haldol 2 mg for agitation prn.  2. The family still interested in transfer to [place]. Will attampt in am.   3. I will follow up.  Electronic Signatures: Kristine LineaPucilowska, Estreya Clay (MD)  (Signed [phone]-Feb-13 20:58)  Authored: Brief Consult Note   Last Updated: 04-Feb-13 20:58 by Kristine LineaPucilowska, Nyiesha Beever (MD)

## 2014-10-07 NOTE — Discharge Summary (Signed)
PATIENT NAME:  Amber [name], Amber [name] MR#:  [id number] DATE OF BIRTH:  10-14-1945  DATE OF ADMISSION:  06/19/2011 DATE OF DISCHARGE:  06/24/2011  For a detailed note, please take a look at the history and physical done on admission by Dr. Camillo FlamingKamran Lateef. Please also look at the discharge summary done by me on 06/22/2011. This covers interim course from 01/07 to 06/24/2011.  DISCHARGE DIAGNOSES: 1. Altered mental status secondary to dehydration and acute renal failure.  2. Acute renal failure.  3. History of chronic liver disease.  4. Hypertension. 5. History of liver cirrhosis. 6. Dementia. 7. History of previous transient ischemic attacks. 8. Coronary artery disease.   DIET: The patient is being discharged on a low-sodium, low-fat, mechanical soft diet.   ACTIVITY: As tolerated.  DISCHARGE FOLLOWUP: Follow-up is with the patient's primary care physician at the assisted living facility. The patient presently is being [facility] and is going to be followed up by the [facility] program.   DISCHARGE MEDICATIONS:  1. Seroquel 50 mg at bedtime.  2. Simvastatin 40 mg at bedtime. 3. Gabapentin 300 mg daily.  4. Aldactone 25 mg daily. 5. Colchicine 0.6 mg daily. 6. Lamictal 200 mg twice a day. 7. Protonix 40 mg daily.  8. Plavix 75 mg daily.  9. Aspirin 81 mg daily.  10. Flonase two sprays to each nostril daily.  11. Spiriva one puff daily. 12. Lactulose 30 mL twice a day.  13. Ventolin inhaler 2 puffs three times daily as needed.  14. Sublingual nitroglycerin as needed.  15. Tramadol 50 mg 1/2 tablet twice a day as needed.  16. Nicotine patch. 17. Robitussin 5 mL every 4 hours p.r.n.  18. Coreg 25 mg twice a day. 19. Ciprofloxacin 250 mg twice a day x5 days.   INTERIM HOSPITAL COURSE: This is a 69 year old female with multiple medical problems as mentioned above who presented to the hospital with altered mental status, acute renal failure, and dehydration. In the interim the  patient was going to be discharged to [facility] skilled nursing facility although they were not able except her on 01/07; therefore, her discharge was held. In the meantime, the patient has remained hemodynamically stable, tolerating p.o. well, and does not show any evidence of hepatic encephalopathy, although was incidentally noted to have a urinary tract infection with abnormal urinalysis on 06/23/2011 and has been started on ciprofloxacin p.o. for treatment for her urinary tract infection. This is a noncomplicated urinary tract infection. The patient presently will now be discharged to [facility], [facility]. For the previous detailed hospital course, please look at my discharge summary which covers hospital course from admission until 06/22/2011.   TIME SPENT: 40 minutes. ____________________________ Rolly PancakeVivek J. Cherlynn KaiserSainani, MD vjs:slb D: 06/24/2011 14:41:02 ET T: 06/24/2011 15:02:04 ET JOB#: [id number]  cc: Rolly PancakeVivek J. Cherlynn KaiserSainani, MD, <Dictator> Houston SirenVIVEK J Khalise Billard MD ELECTRONICALLY SIGNED 06/24/2011 16:07

## 2014-10-07 NOTE — H&P (Signed)
PATIENT NAME:  Amber [name], CABLER MR#:  [id number] DATE OF BIRTH:  [birth year]  DATE OF ADMISSION:  06/19/2011  REFERRING PHYSICIAN: Dr. Jens Som    PRIMARY CARE PHYSICIAN: Dr. Shawn Stall    REASON FOR ADMISSION: Altered mental status, confusion, falls.   HISTORY OF PRESENT ILLNESS: The patient is a 69 year old female with extensive past medical history as listed below including dementia, history of polysubstance abuse, history of cirrhosis with HCV and alcohol abuse, TIA/CVA, CAD status post MI and stents, hypertension, hyperlipidemia, neuropathy, gastroesophageal reflux disease, hepatic encephalopathy, stage II CKD, seizure disorder, COPD, and depression who presents to [facility] ER from skilled nursing facility ([facility]) secondary to altered mental status and confusion with increasing falls of late. She also fell earlier today and was found on the floor at the skilled nursing facility and thereafter sent to the ER for further evaluation. No family members are currently present. I also attempted to call her daughter at home but received no answer. Therefore, history was obtained from speaking with the ER physician and from ER chart review. The patient is answering questions inappropriately at this time and is confused and disoriented and only partially following commands. She is a poor historian and given her confusion and disorientation she is unable to provide any sort of history or review of systems at this point. Apparently she has been falling recently and has had confusion and has had multiple CTs as an outpatient which have been negative apparently. She is awaiting a follow-up appointment to see Neurology as an outpatient. She was brought to the ER today for reasons mentioned above. She was noted to have extremely dry oral mucous membranes as well as poor skin turgor suspicious for clinical dehydration. Her BUN is elevated at 59 with elevated creatinine of 2.66. She is also hyperkalemic. Noncontrast  head CT was obtained which was negative for acute intracranial abnormalities. Given her altered mental status, disorientation, confusion, as well as falls and lab abnormalities noted, hospitalist services were contacted for further evaluation and for hospital admission. No further history is obtainable at this point.   PAST SURGICAL HISTORY PER OLD CHART REVIEW:  1. Left shoulder surgery. 2. Right knee replacement. 3. Left eye surgery for retinal detachment now with left eye prosthesis.  4. Right cataract. 5. Hysterectomy.   PAST MEDICAL HISTORY:  1. Hypertension. 2. Hyperlipidemia.   3. Coronary artery disease status post myocardial infarction and coronary stents. 4. TIA/CVA. The patient was admitted May of 2012 for questionable recurrent TIAs.  5. 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  6. HCV. 7. Former alcohol abuse.  8. Tobacco abuse.  9. Depression with history of suicidal ideation.  10. Chronic obstructive pulmonary disease. 11. Seizure disorder.  12. Arthritis.  13. Gout.  14. 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 15. History of polysubstance abuse in the past with former alcohol abuse and opioid dependence as well as cocaine abuse and tobacco abuse.   16. Admission 04/28/2011 for fall which was secondary to possible syncope versus TIA.  17. 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 The patient was seen by Vascular Surgery and underwent CT angiogram which did not reveal any significant aneurysm.  18. History of right knee swelling with history of knee prosthesis.  19. History of chronic debilitation. The patient is currently at a skilled nursing facility, [facility].  20. History of neuropathy.   ALLERGIES: Darvocet, penicillin, Tylenol.   MEDICATIONS PER REVIEW  OF MAR AT SKILLED NURSING FACILITY: 1. Celebrex 200 mg q.12 hours. 2. Nicotine patch 7 mg 24 hour patch apply  daily.  3. Protonix 40 mg daily.  4. Spironolactone 25 mg p.o. daily. 5. Gabapentin 300 mg p.o. daily.  6. Plavix 75 mg daily. 7. Colcrys 0.6 mg daily.  8. Aspirin 81 mg daily.  9. Spiriva HandiHaler one cap inhaled daily. 10. Lactulose 10 grams/15 mL. The patient takes 15 (10 grams) b.i.d.  11. Coreg 25 mg b.i.d.  12. Lamictal 200 mg p.o. b.i.d.  13. Simvastatin 40 mg p.o. every evening.  14. Seroquel 50 mg p.o. at bedtime. 15. Ventolin HFA 90 mcg inhaled takes 2 puffs inhaled t.i.d. p.r.n. wheezing.  16. Nitrostat 0.4 mg sublingually q.5 minutes x3 p.r.n. chest pain.  17. Ultram 50 mg p.o. q.12 hours. 18. Flonase two sprays each nostril daily.  19. Robitussin-DM one teaspoon (5 mL) by mouth q.4 p.r.n. cough.   FAMILY HISTORY: Unobtainable from the patient. Per chart review, family history is positive for heart disease and hypertension.   SOCIAL HISTORY: Unobtainable from the patient. In November 2012 she was smoking 1/2 pack per day, not sure if she is currently smoking. At that time she had not consumed alcohol in 15 years. She had a history of opioid dependence in the past with history of admission for detoxification and also history of cocaine abuse in the past.   REVIEW OF SYSTEMS: Unobtainable from the patient as she is confused and disoriented.   PHYSICAL EXAMINATION:  VITAL SIGNS: Temperature 98.2, pulse 68, blood pressure 141/89, respirations 16, oxygen sat 100% on room air.   GENERAL: The patient is awake but completely confused and disoriented, not answering questions appropriately. She is moving all of her extremities spontaneously. She is not acutely distressed.   HEENT: Normocephalic, atraumatic. Right pupil is round and reactive to light. She has a left eye prosthesis. Difficult to assess her extraocular muscle function as she only partially follows commands. Anicteric sclerae. Conjunctivae pink. Hearing appears to be intact to voice. Nares without drainage. Oral mucous  is extremely dry but without any lesions noted.   NECK: Supple with full range of motion. No JVD, lymphadenopathy, or carotid bruits bilaterally. No thyromegaly or tenderness to palpation over the thyroid gland.   CHEST: Normal respiratory effort without use of accessory respiratory muscles. Decreased breath sounds bilaterally with hyperresonance to percussion. No wheezing, rhonchi, crackles, or rubs.   CARDIOVASCULAR: S1, S2 positive but distant. Regular rate and rhythm. No murmurs, rubs, or gallops. PMI is non-lateralized.   ABDOMEN: Soft, nondistended. No palpable fluid wave. Nontender to palpation throughout. Nontender and nondistended. Normoactive bowel sounds. No hepatosplenomegaly or palpable masses. No hernias.   EXTREMITIES: No clubbing, cyanosis, or edema. Pedal pulses are palpable bilaterally. No suspicious rashes. Skin turgor is very poor.  LYMPH: No cervical lymphadenopathy.   NEUROLOGIC: The patient is awake but disoriented and confused. When asked where she is, she started talking about her earrings. She answers inappropriately to questions. She is moving all of her extremities spontaneously Babinski sign is absent bilaterally with toes downgoing. Difficult to perform full neurologic exam given the patient's disorientation and confusion and she's only partially following commands.   PSYCHIATRIC: The patient is confused and disoriented.   LABORATORY, DIAGNOSTIC, AND RADIOLOGICAL DATA: CBC within normal limits. BMP sodium 144, potassium 5.7, chloride 111, bicarb 20, BUN 59, creatinine 2.66, glucose 88, calcium 9.7, anion gap 13. Noncontrast head CT no acute intracranial abnormalities. LFTs are pending. Serum  ammonia is pending. EKG reveals normal sinus rhythm, 72 beats per minute with first degree AV block, normal axis. No acute ST or T-wave changes.   ASSESSMENT AND PLAN: This is a 69 year old female with extensive past medical history including cirrhosis with HCV, former alcohol  abuse, hepatic encephalopathy, dementia, TIAs and CVAs, CAD status post MI and stent, seizure disorder, gastroesophageal reflux disease, CKD stage II, history of polysubstance abuse with cocaine, alcohol, tobacco, and opiates, hypertension, hyperlipidemia, and depression here with:  1. Altered mental status/confusion/disorientation and falls, likely multifactorial. Suspect she has probable encephalopathy which could be metabolic versus hepatic along with underlying dementia with acute renal failure now as well as dehydration and hyperkalemia. Recommend hospital admission for further evaluation and management. Await serum ammonia level. She is also mildly acidotic by BMP with normal anion gap. Will await ABG, and NH3, and also check UA to assess for possible underlying UTI which could be contributing.  For now we will provide supportive care and hydrate the patient with IV fluids for her acute renal failure and dehydration. Will avoid nephrotoxins and hepatotoxins. Await serum ammonia level and also keep patient on lactulose and titrate to 3 to 4 soft bowel movements per day. Will hold nephrotoxins (see below). Also, await LFTs.  Obtain frequent neurochecks.  Will see how she responds to initial measures with conservative management. If her condition does not improve or worsens, would consider further work-up and evaluation such as MRI and obtain Neurology evaluation if her symptoms do not improve but will defer this decision to primary team. She will also need a one-to-one sitter as she is confused and disoriented currently. Further work-up and management to follow depending on the patient's clinical course. I attempted to contact her daughter at home who is currently unavailable by phone and no family members are currently present.  2. Acute renal failure, suspect prerenal etiology. She appears volume deplete and dehydrated on exam. Will hold Celebrex and spironolactone for now. Monitor ins and outs strictly.  Hydrate the patient with IV fluids. Reassess renal function in a.m. posthydration. If renal function does not improve or worsens despite the above-mentioned measures, will obtain Nephrology consultation.  3. Hyperkalemia in the setting of acute renal failure with no acute EKG changes. Will hold spironolactone for now. Also, hold Celebrex due to acute renal therapy. She received one dose of bicarb in the ER since she's acidotic (non anion gap) and will also be given a dose of Kayexalate and will be started on IV fluids. Hopefully serum potassium level will come down with these measures and will repeat serum potassium level in two hours and again in the morning.  4. Dehydration. Hold diuretic and provide IV hydration/IV fluids and then reassess.  5. History of TIA/CVA. Continue aspirin and Plavix. Noncontrast head CT is negative for intracranial hemorrhage. Also continue statin therapy as long as her transaminases are normal. 6. History of CAD status post MI with stents. Continue aspirin, Plavix, Coreg, and statin. Her AST and ALT levels are normal.  7. History of seizure disorder. Keep patient on seizure precautions. Continue Lamictal and Neurontin and discontinue Ultram which is known to lower seizure threshold. 8. Dementia. The patient is not currently on any specific therapy for this condition.  9. Gastroesophageal reflux disease. Continue PPI.  10. Chronic obstructive pulmonary disease, stable and without acute exacerbation. Continue Advair, Spiriva, and p.r.n. albuterol MDI.  11. DVT prophylaxis. SCDs, TEDs, aspirin, and Plavix.  CODE STATUS: FULL CODE.   TIME SPENT  ON THIS ADMISSION: Approximately 60 minutes.   ____________________________ Elon Alas, MD knl:[id number] D: 06/19/2011 17:18:27 ET T: 06/19/2011 17:49:42 ET JOB#: [id number]  cc: Elon Alas, MD, <Dictator> Kristopher Oppenheim. Shawn Stall, MD [facility] Dorris Carnes Dinia Joynt MD ELECTRONICALLY SIGNED 07/02/2011 18:56
